# Patient Record
Sex: Female | Born: 1985 | Race: White | Hispanic: Yes | Marital: Married | Smoking: Never smoker
Health system: Southern US, Community
[De-identification: ages and names within clinical notes are randomized; demographics above are authoritative.]

## PROBLEM LIST (undated history)

## (undated) DIAGNOSIS — Z789 Other specified health status: Secondary | ICD-10-CM

---

## 2004-09-10 ENCOUNTER — Inpatient Hospital Stay (HOSPITAL_COMMUNITY): Admission: AD | Admit: 2004-09-10 | Discharge: 2004-09-12 | Payer: Self-pay | Admitting: Family Medicine

## 2011-12-30 ENCOUNTER — Other Ambulatory Visit: Payer: Self-pay

## 2011-12-30 DIAGNOSIS — Z3480 Encounter for supervision of other normal pregnancy, unspecified trimester: Secondary | ICD-10-CM

## 2011-12-30 NOTE — Progress Notes (Signed)
Prenatal labs done today [facility] 

## 2011-12-31 LAB — OBSTETRIC PANEL
Antibody Screen: NEGATIVE
Basophils Relative: 0 % (ref 0–1)
Eosinophils Absolute: 0.1 10*3/uL (ref 0.0–0.7)
HCT: 36.3 % (ref 36.0–46.0)
Hemoglobin: 11.8 g/dL — ABNORMAL LOW (ref 12.0–15.0)
Hepatitis B Surface Ag: NEGATIVE
Lymphs Abs: 1.8 10*3/uL (ref 0.7–4.0)
MCH: 30.5 pg (ref 26.0–34.0)
MCHC: 32.5 g/dL (ref 30.0–36.0)
MCV: 93.8 fL (ref 78.0–100.0)
Monocytes Absolute: 0.7 10*3/uL (ref 0.1–1.0)
Monocytes Relative: 8 % (ref 3–12)
RBC: 3.87 MIL/uL (ref 3.87–5.11)
Rh Type: POSITIVE

## 2011-12-31 LAB — SICKLE CELL SCREEN: Sickle Cell Screen: NEGATIVE

## 2012-01-06 ENCOUNTER — Ambulatory Visit (INDEPENDENT_AMBULATORY_CARE_PROVIDER_SITE_OTHER): Payer: Self-pay | Admitting: Family Medicine

## 2012-01-06 ENCOUNTER — Encounter: Payer: Self-pay | Admitting: Family Medicine

## 2012-01-06 ENCOUNTER — Other Ambulatory Visit (HOSPITAL_COMMUNITY)
Admission: RE | Admit: 2012-01-06 | Discharge: 2012-01-06 | Disposition: A | Discharge status: Home / Self Care | Payer: Self-pay | Source: Ambulatory Visit | Attending: Family Medicine | Admitting: Family Medicine

## 2012-01-06 VITALS — BP 122/75 | Temp 98.7°F | Wt 129.0 lb

## 2012-01-06 DIAGNOSIS — Z113 Encounter for screening for infections with a predominantly sexual mode of transmission: Secondary | ICD-10-CM | POA: Insufficient documentation

## 2012-01-06 DIAGNOSIS — Z01419 Encounter for gynecological examination (general) (routine) without abnormal findings: Secondary | ICD-10-CM | POA: Insufficient documentation

## 2012-01-06 DIAGNOSIS — Z3480 Encounter for supervision of other normal pregnancy, unspecified trimester: Secondary | ICD-10-CM

## 2012-01-06 DIAGNOSIS — N760 Acute vaginitis: Secondary | ICD-10-CM

## 2012-01-06 DIAGNOSIS — Z3400 Encounter for supervision of normal first pregnancy, unspecified trimester: Secondary | ICD-10-CM

## 2012-01-06 DIAGNOSIS — Z3490 Encounter for supervision of normal pregnancy, unspecified, unspecified trimester: Secondary | ICD-10-CM

## 2012-01-06 LAB — POCT WET PREP (WET MOUNT): Clue Cells Wet Prep Whiff POC: NEGATIVE

## 2012-01-06 NOTE — Progress Notes (Signed)
   Subjective:    Tina Pruitt is a G2P1001 Unknown being seen today for her first obstetrical visit.  Her obstetrical history is significant for Late [facility]. Patient does intend to breast feed. Pregnancy history fully reviewed.  No complaints today. Pregnancy has been w/o complications per pt  Patient reports no complaints.  Filed Vitals:   01/06/12 1337  BP: 122/75  Temp: 98.7 F (37.1 C)  Weight: 129 lb (58.514 kg)    HISTORY: OB History    Grav Para Term Preterm Abortions TAB SAB Ect Mult Living   2 1 1  0 0 0 0 0 0 1     # Outc Date GA Lbr Len/2nd Wgt Sex Del Anes PTL Lv   1 TRM 1/06 [redacted]w[redacted]d   F SVD Other No Yes   2 CUR              No past medical history on file. No past surgical history on file. No family history on file.   Exam    Uterus:     Pelvic Exam:    Perineum: No Hemorrhoids, Normal Perineum   Vulva: normal   Vagina:  normal mucosa, normal discharge, wet prep done   pH:    Cervix: Bleeding w/ pap. Closed, thick, long          System:     Skin: normal coloration and turgor, no rashes    Neurologic: oriented, normal mood, grossly non-focal   Extremities: no deformities   HEENT extra ocular movement intact       Neck no masses   Cardiovascular: regular rate and rhythm   Respiratory:  Normal effort   Abdomen: Soft, non-painful to palpation       Fetal hrt tones at 150.     Assessment:    Pregnancy: G2P1001 There is no problem list on file for this patient.       Plan:     Initial labs reviewed Prenatal vitamins. Problem list reviewed and updated. Genetic Screening discussed. Family deciding  Ultrasound discussed; fetal survey: Applying for orange card. Will order after obtaining orange card.  Follow up in 4 weeks. Or sooner if obtain orange card and [place] 50% of 60 min visit spent on counseling and coordination of care.   Family applying for orange card.  Working w/ Marlane Hatcher, Vika Buske, MD 01/06/2012

## 2012-01-06 NOTE — Progress Notes (Signed)
Addended by: Garen Grams F on: 01/06/2012 02:53 PM   Modules accepted: Orders

## 2012-01-06 NOTE — Patient Instructions (Addendum)
Embarazo - Segundo trimestre (Pregnancy - Second Trimester) El segundo trimestre del embarazo (del 3 al 6mes) es un perodo de evolucin rpida para usted y el beb. Hacia el final del sexto mes, el beb mide aproximadamente 23 cm y pesa 680 g. Comenzar a sentir los movimientos del beb entre las 18 y las 20 semanas de embarazo. Podr sentir las pataditas ("quickening en ingls"). Hay un rpido aumento de peso. Puede segregar un lquido claro (calostro) de las mamas. Quizs sienta pequeas contracciones en el vientre (tero) Esto se conoce como falso trabajo de parto o contracciones de Braxton-Hicks. Es como una prctica del trabajo de parto que se produce cuando el beb est listo para salir. Generalmente los problemas de vmitos matinales ya se han superado hacia el final del primer trimestre. Algunas mujeres desarrollan pequeas manchas oscuras (que se denominan cloasma, mscara del embarazo) en la cara que normalmente se van luego del nacimiento del beb. La exposicin al sol empeora las manchas. Puede desarrollarse acn en algunas mujeres embarazadas, y puede desaparecer en aquellas que ya tienen acn. EXAMENES PRENATALES  Durante los exmenes prenatales, deber seguir realizando pruebas de sangre, segn avance el embarazo. Estas pruebas se realizan para controlar su salud y la del beb. Tambin se realizan anlisis de sangre para conocer los niveles de hemoglobina. La anemia (bajo nivel de hemoglobina) es frecuente durante el embarazo. Para prevenirla, se administran hierro y vitaminas. Tambin se le realizarn exmenes para saber si tiene diabetes entre las 24 y las 28 semanas del embarazo. Podrn repetirle algunas de las pruebas que le hicieron previamente.  En cada visita le medirn el tamao del tero. Esto se realiza para asegurarse de que el beb est creciendo correctamente de acuerdo al estado del embarazo.  Tambin en cada visita prenatal controlarn su presin arterial. Esto se realiza  para asegurarse de que no tenga toxemia.  Se controlar su orina para asegurarse de que no tenga infecciones, diabetes o protena en la orina.  Se controlar su peso regularmente para asegurarse que el aumento ocurre al ritmo indicado. Esto se hace para asegurarse que usted y el beb tienen una evolucin normal.  En algunas ocasiones se realiza una prueba de ultrasonido para confirmar el correcto desarrollo y evolucin del beb. Esta prueba se realiza con ondas sonoras inofensivas para el beb, de modo que el profesional pueda calcular ms precisamente la fecha del parto. Algunas veces se realizan pruebas especializadas del lquido amnitico que rodea al beb. Esta prueba se denomina amniocentesis. El lquido amnitico se obtiene introduciendo una aguja en el vientre (abdomen). Se realiza para controlar los cromosomas en aquellos casos en los que existe alguna preocupacin acerca de algn problema gentico que pueda sufrir el beb. En ocasiones se lleva a cabo cerca del final del embarazo, si es necesario inducir al parto. En este caso se realiza para asegurarse que los pulmones del beb estn lo suficientemente maduros como para que pueda vivir fuera del tero. CAMBIOS QUE OCURREN EN EL SEGUNDO TRIMESTRE DEL EMBARAZO Su organismo atravesar numerosos cambios durante el embarazo. Estos pueden variar de una persona a otra. Converse con el profesional que la asiste acerca los cambios que usted note y que la preocupen.  Durante el segundo trimestre probablemente sienta un aumento del apetito. Es normal tener "antojos" de ciertas comidas. Esto vara de una persona a otra y de un embarazo a otro.  El abdomen inferior comenzar a abultarse.  Podr tener la necesidad de orinar con ms frecuencia debido a que   el tero y el beb presionan sobre la vejiga. Tambin es frecuente contraer ms infecciones urinarias durante el embarazo (dolor al orinar). Puede evitarlas bebiendo gran cantidad de lquidos y vaciando  la vejiga antes y despus de mantener relaciones sexuales.  Podrn aparecer las primeras estras en las caderas, abdomen y mamas. Estos son cambios normales del cuerpo durante el embarazo. No existen medicamentos ni ejercicios que puedan prevenir estos cambios.  Es posible que comience a desarrollar venas inflamadas y abultadas (varices) en las piernas. El uso de medias de descanso, elevar sus pies durante 15 minutos, 3 a 4 veces al da y limitar la sal en su dieta ayuda a aliviar el problema.  Podr sentir acidez gstrica a medida que el tero crece y presiona contra el estmago. Puede tomar anticidos, con la autorizacin de su mdico, para aliviar este problema. Tambin es til ingerir pequeas comidas 4 a 5 veces al da.  La constipacin puede tratarse con un laxante o agregando fibra a su dieta. Beber grandes cantidades de lquidos, comer vegetales, frutas y granos integrales es de gran ayuda.  Tambin es beneficioso practicar actividad fsica. Si ha sido una persona activa hasta el embarazo, podr continuar con la mayora de las actividades durante el mismo. Si ha sido menos activa, puede ser beneficioso que comience con un programa de ejercicios, como realizar caminatas.  Puede desarrollar hemorroides (vrices en el recto) hacia el final del segundo trimestre. Tomar baos de asiento tibios y utilizar cremas recomendadas por el profesional que lo asiste sern de ayuda para los problemas de hemorroides.  Tambin podr sentir dolor de espalda durante este momento de su embarazo. Evite levantar objetos pesados, utilice zapatos de taco bajo y mantenga una buena postura para ayudar a reducir los problemas de espalda.  Algunas mujeres embarazadas desarrollan hormigueo y adormecimiento de la mano y los dedos debido a la hinchazn y compresin de los ligamentos de la mueca (sndrome del tnel carpiano). Esto desaparece una vez que el beb nace.  Como sus pechos se agrandan, necesitar un sujetador  ms grande. Use un sostn de soporte, cmodo y de algodn. No utilice un sostn para amamantar hasta el ltimo mes de embarazo si va a amamantar al beb.  Podr observar una lnea oscura desde el ombligo hacia la zona pbica denominada linea nigra.  Podr observar que sus mejillas se ponen coloradas debido al aumento de flujo sanguneo en la cara.  Podr desarrollar "araitas" en la cara, cuello y pecho. Esto desaparece una vez que el beb nace. INSTRUCCIONES PARA EL CUIDADO DOMICILIARIO  Es extremadamente importante que evite el cigarrillo, hierbas medicinales, alcohol y las drogas no prescriptas durante el embarazo. Estas sustancias qumicas afectan la formacin y el desarrollo del beb. Evite estas sustancias durante todo el embarazo para asegurar el nacimiento de un beb sano.  La mayor parte de los cuidados que se aconsejan son los mismos que los indicados para el primer trimestre del embarazo. Cumpla con las citas tal como se le indic. Siga las instrucciones del profesional que lo asiste con respecto al uso de los medicamentos, el ejercicio y la dieta.  Durante el embarazo debe obtener nutrientes para usted y para su beb. Consuma alimentos balanceados a intervalos regulares. Elija alimentos como carne, pescado, leche y otros productos lcteos descremados, vegetales, frutas, panes integrales y cereales. El profesional le informar cul es el aumento de peso ideal.  Las relaciones sexuales fsicas pueden continuarse hasta cerca del fin del embarazo si no existen otros problemas. Estos   problemas pueden ser la prdida temprana (prematura) de lquido amnitico de las membranas, sangrado vaginal, dolor abdominal u otros problemas mdicos o del embarazo.  Realice actividad fsica todos los das, si no tiene restricciones. Consulte con el profesional que la asiste si no sabe con certeza si determinados ejercicios son seguros. El mayor aumento de peso tiene lugar durante los ltimos 2 trimestres del  embarazo. El ejercicio la ayudar a:  Controlar su peso.  Ponerla en forma para el parto.  Ayudarla a perder peso luego de haber dado a luz.  Use un buen sostn o como los que se usan para hacer deportes para aliviar la sensibilidad de las mamas. Tambin puede serle til si lo usa mientras duerme. Si pierde calostro, podr utilizar apsitos en el sostn.  No utilice la baera con agua caliente, baos turcos y saunas durante el embarazo.  Utilice el cinturn de seguridad sin excepcin cuando conduzca. Este la proteger a usted y al beb en caso de accidente.  Evite comer carne cruda, queso crudo, y el contacto con los utensilios y desperdicios de los gatos. Estos elementos contienen grmenes que pueden causar defectos de nacimiento en el beb.  El segundo trimestre es un buen momento para visitar a su dentista y evaluar su salud dental si an no lo ha hecho. Es importante mantener los dientes limpios. Utilice un cepillo de dientes blando. Cepllese ms suavemente durante el embarazo.  Es ms fcil perder algo de orina durante el embarazo. Apretar y fortalecer los msculos de la pelvis la ayudar con este problema. Practique detener la miccin cuando est en el bao. Estos son los mismos msculos que necesita fortalecer. Son tambin los mismos msculos que utiliza cuando trata de evitar los gases. Puede practicar apretando estos msculos 10 veces, y repetir esto 3 veces por da aproximadamente. Una vez que conozca qu msculos debe apretar, no realice estos ejercicios durante la miccin. Puede favorecerle una infeccin si la orina vuelve hacia atrs.  Pida ayuda si tiene necesidades econmicas, de asesoramiento o nutricionales durante el embarazo. El profesional podr ayudarla con respecto a estas necesidades, o derivarla a otros especialistas.  La piel puede ponerse grasa. Si esto sucede, lvese la cara con un jabn suave, utilice un humectante no graso y maquillaje con base de aceite o  crema. CONSUMO DE MEDICAMENTOS Y DROGAS DURANTE EL EMBARAZO  Contine tomando las vitaminas apropiadas para esta etapa tal como se le indic. Las vitaminas deben contener un miligramo de cido flico y deben suplementarse con hierro. Guarde todas las vitaminas fuera del alcance de los nios. La ingestin de slo un par de vitaminas o tabletas que contengan hierro puede ocasionar la muerte en un beb o en un nio pequeo.  Evite el uso de medicamentos, inclusive los de venta libre y hierbas que no hayan sido prescriptos o indicados por el profesional que la asiste. Algunos medicamentos pueden causar problemas fsicos al beb. Utilice los medicamentos de venta libre o de prescripcin para el dolor, el malestar o la fiebre, segn se lo indique el profesional que lo asiste. No utilice aspirina.  El consumo de alcohol est relacionado con ciertos defectos de nacimiento. Esto incluye el sndrome de alcoholismo fetal. Debe evitar el consumo de alcohol en cualquiera de sus formas. El cigarrillo causa nacimientos prematuros y bebs de bajo peso. El uso de drogas recreativas est absolutamente prohibido. Son muy nocivas para el beb. Un beb que nace de una madre adicta, ser adicto al nacer. Ese beb tendr los mismos   causa nacimientos prematuros y bebs de [place]. El uso de drogas recreativas est absolutamente prohibido. Son muy nocivas para el beb. Un beb que nace de [employer], ser adicto al nacer. Ese beb tendr los mismos sntomas de abstinencia que un adulto.   Infrmele al profesional si consume alguna droga.   No consuma drogas ilegales. Pueden causarle mucho dao al beb.  SOLICITE ATENCIN MDICA SI: Tiene preguntas o preocupaciones durante su embarazo. Es mejor que llame para Science writer las dudas que esperar hasta su prxima visita prenatal. Thressa Sheller forma se sentir ms tranquila.  SOLICITE ATENCIN MDICA DE INMEDIATO SI:  La temperatura oral se eleva sin motivo por encima de 102 F (38.9 C) o segn le indique el profesional que lo asiste.   Tiene una prdida de lquido por la vagina (canal de parto). Si sospecha una ruptura de las [place], tmese la  temperatura y llame al profesional para informarlo sobre esto.   Observa unas pequeas manchas, una hemorragia vaginal o elimina cogulos. Notifique al profesional acerca de la cantidad y de cuntos apsitos est utilizando. Unas pequeas manchas de sangre son algo comn durante el Psychiatrist, especialmente despus de Sales promotion account executive.   Presenta un olor desagradable en la secrecin vaginal y observa un cambio en el color, de transparente a blanco.   Contina con las nuseas y no obtiene alivio de los remedios indicados. Vomita sangre o algo similar a la borra del caf.   [place] o sube ms de 900 g. en una semana, o segn lo indicado por el profesional que la asiste.   Observa que se le [employer], las manos, los pies o las piernas.   Ha estado expuesta a la rubola y no ha sufrido la enfermedad.   Ha estado expuesta a la quinta enfermedad o a la varicela.   Presenta dolor abdominal. Las molestias en el ligamento redondo son Neomia Dear causa no cancerosa (benigna) frecuente de dolor abdominal durante el embarazo. El profesional que la asiste deber evaluarla.   Presenta dolor de cabeza intenso que no se [place].   Presenta fiebre, diarrea, dolor al orinar o le falta la respiracin.   Presenta dificultad para ver, visin borrosa, o visin doble.   Sufre una cada, un accidente de trnsito o cualquier tipo de trauma.   Vive en un hogar en el que existe violencia fsica o mental.  Document Released: 06/03/2005 Document Revised: 08/13/2011 [facility] Patient Information 2012 [place], [place]. Embarazo - Primer trimestre (Pregnancy - First Trimester) Durante el acto sexual, millones de espermatozoides ingresan a la vagina. Slo Education administrator y Economist en vulo femenino mientras se encuentre en las [place] de [place]. Una semana despus, el huevo fertilizado se implanta en la pared del tero. Un embrin comienza a desarrollarse y se convierte en un beb. Entre la 6 y la 8 semanas se  forman los ojos y el rostro y el latido cardaco se detecta con Nurse, adult. Hacia el final de la 12 semana (primer trimestre) se han formado todos los rganos del beb. Ahora que est embarazada, desear hacer todo lo posible para tener un beb sano. Dos de las cosas ms importantes son: Winferd Humphrey buena atencin prenatal y seguir las indicaciones del profesional que la asiste. La atencin prenatal incluye toda la asistencia mdica que usted recibe antes del nacimiento del beb. Se lleva a cabo para prevenir problemas durante el embarazo y [place]. EXAMENES PRENATALES  Durante los controles prenatales, le tomarn la presin arterial, verificarn su  peso y [place] harn [place] de [place]. Todo esto se realiza para asegurar que usted progresa de [place] normal y Office manager.   Una mujer embarazada ganar de 25 a 35 libras (8 a 12 kilos) Academic librarian. Sin embargo, si tiene sobrepeso o bajo peso el mdico le aconsejar acerca de [place].   Entre los [employer] solicitarn anlisis de [place], cultivos de cuello de [place], [place] y [place]. Estas pruebas se realizan para controlar su salud y la del beb. Se recomienda realizar todos los [employer] y tambin pruebas para la deteccin del VIH, si usted lo autoriza. Este es el virus que causa el [place]. Estas pruebas se realizan porque existen medicamentos que podrn administrarle para prevenir que el beb nazca con esta infeccin, si usted estuviera infectada y no lo supiera. Tambin se solicitan anlisis de sangre para conocer su grupo sanguneo, infecciones previas y para Chief Operating Officer los glbulos rojos (hemoglobina).   Un bajo nivel de hemoglobina (anemia) es frecuente durante el embarazo. Para prevenirla, se administran hierro y vitaminas. En etapas posteriores del [employer] solicitarn anlisis de sangre para descartar diabetes y otros anlisis para Sales promotion account executive otros problemas. Los anlisis son necesarios para verificar  que usted y el beb estn bien.   Necesitar otras pruebas que se realizan para asegurarse de que el beb est saludable.  CAMBIOS DURANTE EL PRIMER TRIMESTRE (LOS TRES PRIMEROS MESES DEL EMBARAZO). Su organismo atravesar numerosos cambios Academic librarian. Estos pueden variar de Neomia Dear persona a otra. [place]verse con el profesional que la asiste acerca los cambios que usted note y que la preocupen.  El perodo menstrual se detiene.   El vulo y los espermatozoides llevan los genes que determinan cmo seremos. Sus genes y los de su pareja forman un beb. Los genes del varn determinan si ser un nio o una nia.   No tome ningn medicamento a menos que se lo haya indicado el profesional que la asiste.   Aumentar la circunferencia de su cuerpo y se sentir hinchada.   Podr sentir nuseas o vmitos. Si los vmitos son incontrolables, comunquese con el profesional que la asiste.   Sus mamas comenzarn a agrandarse y estarn ms sensibles.   Los pezones salen hacia afuera y se oscurecen.   Mayor necesidad de Geographical information systems officer. Si siente dolor al orinar podra tener una infeccin urinaria.   Cansarse fcilmente.   Prdida del apetito.   Retorcijones por ciertos tipos de alimentos.   Al principio, podr ganar o perder algo de peso.   Podr sentir Warden/ranger (alegra por Firefighter o preocupacin acerca de que haya algo mal en el embarazo o con el beb).   Podr tener sueos ms vvidos y fuertes.  INSTRUCCIONES PARA EL CUIDADO DOMICILIARIO  Es extremadamente importante que evite el cigarrillo, el alcohol y las drogas no prescriptas durante el Psychiatrist. Estas sustancias qumicas afectan la formacin y el desarrollo del beb. Evite estas sustancias qumicas durante todo el embarazo para asegurar el nacimiento de un beb sano.   Comience las consultas prenatales alrededor de la 12 semana de [place]. Generalmente se programan cada mes al principio y se hacen ms frecuentes en  los 2 ltimos meses antes del parto. Cumpla con las citas tal como se le indic. Siga las indicaciones del profesional con respecto a los medicamentos que toma, los anlisis de laboratorio, la actividad fsica y Psychologist, forensic.   Durante el embarazo debe obtener nutrientes para usted y para su beb.  Consuma alimentos balanceados. Elija alimentos como carne, pescado, [place] y otros productos lcteos descremados, vegetales, frutas, panes integrales y cereales. El Equities trader cul es el aumento de peso ideal.   Las nuseas matinales pueden aliviarse si come algunas galletitas saladas en la cama. Coma dos galletitas antes de levantarse por la maana. Tambin puede comer galletitas sin sal.   Tome 4  5 comidas pequeas por Geophysical data processor de 3 comidas abundantes para evitar las nuseas y los vmitos.   Tambin puede prevenir las nuseas bebiendo lquidos entre comidas en lugar de [place] come.   Si no tiene problemas, puede continuar [employer] a lo largo de todo Firefighter. Algunos problemas pueden ser la prdida prematura de lquido amnitico de las [place], hemorragias vaginales o dolor en el abdomen.   Realice [employer], si no tiene restricciones. Consulte con el profesional que la asiste o con un fisioterapeuta si no sabe con certeza si determinados ejercicios son seguros. En los dos ltimos trimestres del embarazo puede ocurrir un aumento de [place]. La actividad fsica ayudar a:   Engineering geologist.   Mantenerse en forma.   Prepararse para el parto y [place] de [place].   Ayuda a perder el peso ganado en el embarazo luego del [place].   Use un buen sostn o como los que se usan para hacer deportes para Paramedic la sensibilidad de las [place]. Tambin puede serle til si lo [place] mientras duerme.   Consulte cuando tendr el curso preparatorio para Engineer, manufacturing systems. Comience las clases cuando se las ofrezcan.   No utilice la baera con agua  caliente, baos turcos y saunas.   Colquese el cinturn de seguridad cuando conduzca. Este la proteger a usted y al beb en caso de accidente.   Evite comer carne cruda y el contacto con los utensilios y desperdicios de los gatos. Estos elementos contienen grmenes que pueden causar defectos de nacimiento en el beb.   El primer trimestre es un buen momento para visitar a su dentista y Software engineer. Es importante mantener los dientes limpios. Use un cepillo de dientes suave y cepllese con ms suavidad durante el [employer].   Pida ayuda si tiene necesidades econmicas, de asesoramiento o nutricionales durante el [place]. El profesional podr ayudarla con respecto a estas necesidades, o derivarla a otros especialistas.   No tome ningn medicamento a menos que se lo haya indicado el profesional que la asiste.   Informe al profesional que lo asiste si es vctima de algn tipo de Advertising copywriter, ya sea mental o fsica.   Haga una lista de nmeros de telfono de Associate Professor de familiares, amigos, hospital, polica y bomberos.   Anote sus preguntas. Llvelas con usted en su prxima visita de control.   No utilice duchas vaginales.   No cruce las piernas.   Si ha estado parada durante largos perodos de [place], mueva los pies o realice pequeos pasos en crculo.   Podr tener secreciones vaginales que pueden requerir una compresa o [place]. No utilice tampones ni compresas con perfume.  EL CONSUMO DE MEDICAMENTOS Y DROGAS DURANTE EL EMBARAZO  Tome las vitaminas apropiadas para esta etapa tal como se le indic. Las vitaminas deben contener 1 miligramo de cido flico. Guarde todas las vitaminas fuera del alcance de los nios. La ingestin de slo un par de vitaminas o tabletas que contengan hierro pueden ocasionar la [employer] en un beb o en un nio pequeo.  Evite el uso de medicamentos, inclusive hierbas, que no hayan sido prescriptos o indicados por el profesional que la  asiste. Utilice los medicamentos de venta libre o de prescripcin para Chief Technology Officer, Environmental health practitioner o la [place], segn se lo indique el profesional que lo asiste. No utilice aspirina, ibuprofeno o naproxeno a menos que el profesional la autorice.   El consumo de alcohol se relaciona con ciertos defectos de nacimiento. Entre ellos el sndrome de alcoholismo fetal. Debe evitar el consumo de alcohol en cualquiera de sus formas. El cigarrillo causa nacimientos prematuros y bebs de [place].   Las drogas ocasionan terribles daos al beb. Estn absolutamente prohibidas. Un beb que nace de [employer], ser adicto al nacer. Ese beb tendr los mismos sntomas de abstinencia que un adulto.   No consuma drogas. Pueden daar gravemente al beb.   Converse con el mdico acerca de cualquier medicamento que est tomando y la razn por la cual los toma.  EL ABORTO ESPONTNEO ES UNA SITUACIN FRECUENTE Esto no significa que ha hecho las cosas mal. No es un motivo para preocuparse en caso de un nuevo embarazo. El profesional que la asiste la ayudar si tiene preguntas para formular. Si ha sufrido un aborto espontneo, Pension scheme manager someterse a Futures trader de curetaje, SOLICITE ATENCIN MDICA SI: Tiene alguna preocupacin durante el embarazo. Es mejor que llame para formular las preguntas si no puede esperar hasta la prxima visita, que sentirse preocupada por ellas. SOLICITE ATENCIN MDICA DE INMEDIATO SI:  La temperatura oral se eleva sin motivo por encima de 102 F (38.9 C) o segn le indique el profesional que lo asiste.   Tiene una prdida de lquido por la vagina (canal de parto). Si sospecha una ruptura de las [place], tmese la temperatura y llame al profesional para informarlo sobre esto.   Observa unas pequeas manchas o una hemorragia vaginal. Notifique al profesional acerca de la cantidad y de cuntos apsitos est utilizando.   Presenta un olor desagradable en la secrecin vaginal y  observa un cambio en el color.   Contina con las nuseas y no obtiene alivio de los remedios indicados. Vomita sangre o algo similar a la borra del caf.   Pierde ms de 1 Kg de peso en [employer].   Aumenta ms de 1 Kg en una semana y [address], las manos, los pies o las piernas hinchados.   Aumenta ms de 2,5 Kg en una semana (aunque no tenga las manos, pies, piernas o el rostro hinchados).   Ha estado expuesta a la rubola y no ha sufrido la enfermedad. Ha estado expuesta a la quinta enfermedad o a la varicela.   Ha estado expuesta a la quinta enfermedad o a la varicela.   Presenta dolor abdominal. Las molestias en el ligamento redondo son Neomia Dear causa benigna (no cancerosa) frecuente de dolor abdominal durante el embarazo. El profesional que la asiste deber evaluarla.   Presenta dolor de Renne Musca, diarrea, dolor al orinar o le falta la respiracin.   Sufre una cada, un accidente automovilstico o algn traumatismo.   Sufre violencia fsica o mental en su hogar.  Document Released: 06/03/2005 Document Revised: 08/13/2011 [facility] Patient Information 2012 [place], [place].

## 2012-01-06 NOTE — Progress Notes (Signed)
Addended by: Garen Grams F on: 01/06/2012 02:51 PM   Modules accepted: Orders

## 2012-01-11 ENCOUNTER — Encounter: Payer: Self-pay | Admitting: Family Medicine

## 2012-01-11 NOTE — Progress Notes (Signed)
Note reviewed.  Will need to obtain some more history to determine if pt needs an early glucola. If unsure LMP, will need dating sono as soon as possible.

## 2012-02-02 ENCOUNTER — Telehealth: Payer: Self-pay | Admitting: Family Medicine

## 2012-02-02 NOTE — Telephone Encounter (Signed)
Husband called to let us know pt has a OC and fmc can  sch [place]. [employer]

## 2012-02-05 ENCOUNTER — Ambulatory Visit (INDEPENDENT_AMBULATORY_CARE_PROVIDER_SITE_OTHER): Payer: Self-pay | Admitting: Family Medicine

## 2012-02-05 VITALS — BP 105/68 | Temp 98.5°F | Wt 134.4 lb

## 2012-02-05 DIAGNOSIS — Z3480 Encounter for supervision of other normal pregnancy, unspecified trimester: Secondary | ICD-10-CM

## 2012-02-05 DIAGNOSIS — Z3490 Encounter for supervision of normal pregnancy, unspecified, unspecified trimester: Secondary | ICD-10-CM

## 2012-02-05 NOTE — Patient Instructions (Addendum)
Please return in 1 month.  Hemorragia vaginal durante el embarazo, segundo trimestre (Vaginal Bleeding During Pregnancy, Second Trimester)   urante el embarazo es relativamente frecuente que se presente una pequea hemorragia (manchas). Esta situacin generalmente mejora por s misma. Existen muchas causas para la hemorragia o prdidas durante el embarazo. Algunas hemorragias pueden estar relacionadas al [employer] Lots y otras no. Si aparecen retortijones con la hemorragia es ms serio y preocupante. Informe al mdico si tiene cualquier tipo de hemorragia vaginal.  CAUSAS  Infeccin, inflamacin o tumores en el tero.   La placenta puede estar cubriendo la apertura del tero de [place] total o parcial.   La placenta puede haberse separado del tero.   Usted puede estar sufriendo un trabajo de parto prematuro.   El tero no est lo suficientemente fuerte para Pharmacologist al beb dentro del tero (insuficiencia cervical).   Muchos quistes pequeos en el tero en lugar del tejido del embarazo (embarazo molar).  SNTOMAS  Hemorragia o goteo vaginal con o sin retortijones.   Contracciones uterinas.   Flujo vaginal anormal.   Podra tener goteo despus de Management consultant.  DIAGNSTICO Para evaluar el embarazo, el mdico podr:  Education officer, environmental un examen plvico.   Tomar anlisis de [place].   Realizar una prueba de [place].  Es importante seguir las indicaciones del profesional que la asiste.  TRATAMIENTO  Evaluacin del embarazo con anlisis de [place] y [place].   Reposo en cama (slo levantarse para ir al bao).   Inmunizacin con Rho-gam si la madre es Rh negativo y el padre es Rh positivo.   Si tiene contracciones uterinas, se le podrn administrar medicamentos para detenerlas.   Si tiene insuficiencia cervical, se le podr realizar una sutura en el tero para cerrarlo.  INSTRUCCIONES PARA EL CUIDADO DOMICILIARIO  Si el mdico le indica reposo en cama, usted  necesitar hacer algunos arreglos para que otra persona se ocupe del cuidado de los nios y de otras responsabilidades adicionales. Sin embargo, podr autorizarla a Building surveyor.   Lleve un registro de la cantidad y la saturacin de las toallas higinicas que Landscape architect. Escriba esta informacin:   No use tampones. No utilice duchas vaginales.   No tenga relaciones sexuales u orgasmos hasta que el mdico la autorice.   Guarde una muestra de los tejidos para que el profesional lo inspeccione.   Tome medicamentos para el dolor slo con el permiso del mdico.   No tome aspirina, ya que puede causar hemorragias.   No realice ejercicio, actividades estresantes ni levante peso sin el permiso del mdico.  SOLICITE ATENCIN MDICA INMEDIATAMENTE SI:  Siente calambres intensos en el estmago, en la espalda o en el vientre (abdomen).   Tiene contracciones uterinas.   Usted tiene una temperatura oral de ms de 102 F (38.9 C) y no puede controlarla con medicamentos.   Comienza a sentir escalofros.   Elimina cogulos o tejidos grandes.   La hemorragia aumenta o se siente mareada dbil o tiene episodios de desmayos.   Tiene una prdida de lquido por la vagina.  Document Released: 06/03/2005 Document Revised: 08/13/2011 [facility] Patient Information 2012 [place], [place].

## 2012-02-05 NOTE — Progress Notes (Signed)
[place] pt. Doing well. No complaints today.  Early glucola not indicated due to negative fmhx and previous pregnancy w/o complication w/ 6lb baby, and BMI <29.   Labor precautions reviewed  Reasons for MAU visit reviewed.   Will send for [place] complete at womens.  VS reviewed See pt back in 1 month

## 2012-02-17 ENCOUNTER — Ambulatory Visit (HOSPITAL_COMMUNITY)
Admission: RE | Admit: 2012-02-17 | Discharge: 2012-02-17 | Disposition: A | Discharge status: Home / Self Care | Payer: Self-pay | Source: Ambulatory Visit | Attending: Family Medicine | Admitting: Family Medicine

## 2012-02-17 DIAGNOSIS — O0930 Supervision of pregnancy with insufficient antenatal care, unspecified trimester: Secondary | ICD-10-CM | POA: Insufficient documentation

## 2012-02-17 DIAGNOSIS — Z3689 Encounter for other specified antenatal screening: Secondary | ICD-10-CM | POA: Insufficient documentation

## 2012-02-17 DIAGNOSIS — Z3490 Encounter for supervision of normal pregnancy, unspecified, unspecified trimester: Secondary | ICD-10-CM

## 2012-03-04 ENCOUNTER — Ambulatory Visit (INDEPENDENT_AMBULATORY_CARE_PROVIDER_SITE_OTHER): Payer: Self-pay | Admitting: Family Medicine

## 2012-03-04 DIAGNOSIS — Z331 Pregnant state, incidental: Secondary | ICD-10-CM

## 2012-03-04 NOTE — Progress Notes (Signed)
[place] pt doing very well.  Occasional LE edema by the end of the day after ambulation that improves w/ rest and elevation. This was present w/ other pregnancies. Denies any dysuria, or frothy urine  [place] reviewed and normal Reviewed labor precautions  Reasones for MAU visit reviewed  See pt back in 1 month

## 2012-03-04 NOTE — Patient Instructions (Addendum)
Come back in 1 month  Hemorragia vaginal durante el embarazo, segundo trimestre (Vaginal Bleeding During Pregnancy, Second Trimester) Durante el embarazo es relativamente frecuente que se presente una pequea hemorragia (manchas). Esta situacin generalmente mejora por s misma. Existen muchas causas para la hemorragia o prdidas durante el embarazo. Algunas hemorragias pueden estar relacionadas al [employer] y otras no. Si aparecen retortijones con la hemorragia es ms serio y preocupante. Informe al mdico si tiene cualquier tipo de hemorragia vaginal.  CAUSAS  Infeccin, inflamacin o tumores en el tero.   La placenta puede estar cubriendo la apertura del tero de [place] total o parcial.   La placenta puede haberse separado del tero.   Usted puede estar sufriendo un trabajo de parto prematuro.   El tero no est lo suficientemente fuerte para Pharmacologist al beb dentro del tero (insuficiencia cervical).   Muchos quistes pequeos en el tero en lugar del tejido del embarazo (embarazo molar).  SNTOMAS  Hemorragia o goteo vaginal con o sin retortijones.   Contracciones uterinas.   Flujo vaginal anormal.   Podra tener goteo despus de Management consultant.  DIAGNSTICO Para evaluar el embarazo, el mdico podr:  Education officer, environmental un examen plvico.   Tomar anlisis de [place].   Realizar una prueba de [place].  Es importante seguir las indicaciones del profesional que la asiste.  TRATAMIENTO  Evaluacin del embarazo con anlisis de [place].   Reposo en cama (slo levantarse para ir al bao).   Inmunizacin con Rho-gam si la madre es Rh negativo y el padre es Rh positivo.   Si tiene contracciones uterinas, se le podrn administrar medicamentos para detenerlas.   Si tiene insuficiencia cervical, se le podr realizar una sutura en el tero para cerrarlo.  INSTRUCCIONES PARA EL CUIDADO DOMICILIARIO  Si el mdico le indica reposo en cama, usted necesitar hacer  algunos arreglos para que otra persona se ocupe del cuidado de los nios y de otras responsabilidades adicionales. Sin embargo, podr autorizarla a Building surveyor.   Lleve un registro de la cantidad y la saturacin de las toallas higinicas que Landscape architect. Escriba esta informacin:   No use tampones. No utilice duchas vaginales.   No tenga relaciones sexuales u orgasmos hasta que el mdico la autorice.   Guarde una muestra de los tejidos para que el profesional lo inspeccione.   Tome medicamentos para el dolor slo con el permiso del mdico.   No tome aspirina, ya que puede causar hemorragias.   No realice ejercicio, actividades estresantes ni levante peso sin el permiso del mdico.  SOLICITE ATENCIN MDICA INMEDIATAMENTE SI:  Siente calambres intensos en el estmago, en la espalda o en el vientre (abdomen).   Tiene contracciones uterinas.   Usted tiene una temperatura oral de ms de 102 F (38.9 C) y no puede controlarla con medicamentos.   Comienza a sentir escalofros.   Elimina cogulos o tejidos grandes.   La hemorragia aumenta o se siente mareada dbil o tiene episodios de desmayos.   Tiene una prdida de lquido por la vagina.  Document Released: 06/03/2005 Document Revised: 08/13/2011 [facility] Patient Information 2012 [place], [place].   Embarazo - Segundo trimestre (Pregnancy - Second Trimester) El segundo trimestre del Psychiatrist (del 3 al ) es un perodo de evolucin rpida para usted y el beb. Hacia el final del sexto mes, el beb mide aproximadamente 23 cm y pesa 680 g. Comenzar a Pharmacologist del beb entre las 18 y las 20 [address] de  embarazo. Podr sentir las pataditas ("quickening en ingls"). Hay un rpido [employer]. Puede segregar un lquido claro Charity fundraiser) de las [place]. Quizs sienta pequeas contracciones en el vientre (tero) Esto se conoce como falso trabajo de parto o contracciones de [place]. Es como una  prctica del trabajo de parto que se produce cuando el beb est listo para salir. Generalmente los problemas de vmitos matinales ya se han superado hacia el final del Medical sales representative. Algunas mujeres desarrollan pequeas manchas oscuras (que se denominan cloasma, mscara del embarazo) en la cara que normalmente se van luego del nacimiento del beb. La exposicin al sol empeora las manchas. Puede desarrollarse acn en algunas mujeres embarazadas, y puede desaparecer en aquellas que ya tienen acn. EXAMENES PRENATALES  Durante los [employer], deber seguir realizando pruebas de [place], segn avance el [place]. Estas pruebas se realizan para controlar su salud y la del beb. Tambin se realizan anlisis de sangre para [employer] niveles de [place]. La anemia (bajo nivel de hemoglobina) es frecuente durante el embarazo. Para prevenirla, se administran hierro y vitaminas. Tambin se le realizarn exmenes para saber si tiene diabetes entre las 24 y las 28 semanas del [place]. Podrn repetirle algunas de las [employer]ov[employer] Enterprises hicieron previamente.   En cada visita le medirn el tamao del tero. Esto se realiza para asegurarse de que el beb est creciendo correctamente de acuerdo al estado del [place].   Tambin en cada visita prenatal controlarn su presin arterial. Esto se realiza para asegurarse de que no tenga toxemia.   Se controlar su orina para asegurarse de que no tenga infecciones, diabetes o protena en la orina.   Se controlar su peso regularmente para asegurarse que el aumento ocurre al ritmo indicado. Esto se hace para asegurarse que usted y el beb tienen una evolucin normal.   En algunas ocasiones se realiza una prueba de ultrasonido para confirmar el correcto desarrollo y evolucin del beb. Esta prueba se realiza con ondas sonoras inofensivas para el beb, de modo que el profesional pueda calcular ms precisamente la fecha del [place].  Algunas veces se realizan pruebas  especializadas del lquido amnitico que rodea al beb. Esta prueba se denomina amniocentesis. El lquido amnitico se obtiene introduciendo una aguja en el vientre (abdomen). Se realiza para Conservator, museum/gallery en los que existe alguna preocupacin acerca de algn problema gentico que pueda sufrir el beb. En ocasiones se lleva a cabo cerca del final del embarazo, si es necesario inducir al Apple Computer. En este caso se realiza para asegurarse que los pulmones del beb estn lo suficientemente maduros como para que pueda vivir fuera del tero. CAMBIOS QUE OCURREN EN EL SEGUNDO TRIMESTRE DEL EMBARAZO Su organismo atravesar numerosos cambios durante el [employer] Lots. Estos pueden variar de Neomia Dear persona a otra. [place] con el profesional que la asiste acerca los cambios que usted note y que la preocupen.  Durante el segundo trimestre probablemente sienta un aumento del apetito. Es normal tener "antojos" de Development worker, community. Esto vara de Neomia Dear persona a otra y de un embarazo a Therapist, art.   El abdomen inferior comenzar a abultarse.   Podr tener la necesidad de Geographical information systems officer con ms frecuencia debido a que el tero y el beb presionan sobre la vejiga. Tambin es frecuente contraer ms infecciones urinarias durante el embarazo (dolor al ConocoPhillips). Puede evitarlas bebiendo gran cantidad de lquidos y vaciando la vejiga antes y despus de Sales promotion account executive.   Podrn aparecer las primeras Albertson's caderas,  abdomen y mamas. Estos son cambios normales del cuerpo durante el [place]. No existen medicamentos ni ejercicios que puedan prevenir CarMax.   Es posible que comience a desarrollar venas inflamadas y abultadas (varices) en las piernas. El uso de medias de descanso, Optometrist sus pies durante 15 minutos, 3 a 4 veces al da y Film/video editor la sal en su dieta ayuda a Journalist, newspaper.   Podr sentir Engineering geologist gstrica a medida que el tero crece y Doctor, general practice. Puede tomar  anticidos, con la autorizacin de su mdico, para Financial planner. Tambin es til ingerir pequeas comidas 4 a 5 veces al Futures trader.   La constipacin puede tratarse con un laxante o agregando fibra a su dieta. Beber grandes cantidades de lquidos, comer vegetales, frutas y granos integrales es de [place].   Tambin es beneficioso practicar actividad fsica. Si ha sido una persona Engineer, mining, podr continuar con la Harley[employer]Davidson de las actividades durante el mismo. Si ha sido [employer], puede ser beneficioso que comience con un programa de ejercicios, Museum/gallery exhibitions officer.   Puede desarrollar hemorroides (vrices en el recto) hacia el final del segundo trimestre. Tomar baos de asiento tibios y Chemical engineer cremas recomendadas por el profesional que lo asiste sern de ayuda para los problemas de hemorroides.   Tambin podr Financial risk analyst de espalda durante este momento de su embarazo. Evite levantar objetos pesados, utilice zapatos de taco bajo y [place] buena postura para ayudar a reducir los problemas de [place].   Algunas mujeres embarazadas desarrollan hormigueo y adormecimiento de la mano y los dedos debido a la hinchazn y compresin de los ligamentos de la mueca (sndrome del tnel carpiano). Esto desaparece una vez que el beb nace.   Como sus pechos se agrandan, Pension scheme manager un sujetador ms grande. Use un sostn de soporte, cmodo y de algodn. No utilice un sostn para amamantar hasta el ltimo mes de embarazo si va a amamantar al beb.   Podr observar una lnea oscura desde el ombligo hacia la zona pbica denominada linea nigra.   Podr observar que sus mejillas se ponen coloradas debido al aumento de flujo sanguneo en la cara.   Podr desarrollar "araitas" en la cara, cuello y pecho. Esto desaparece una vez que el beb nace.  INSTRUCCIONES PARA EL CUIDADO DOMICILIARIO  Es extremadamente importante que evite el cigarrillo, hierbas medicinales, alcohol y las  drogas no prescriptas durante el Psychiatrist. Estas sustancias qumicas afectan la formacin y el desarrollo del beb. Evite estas sustancias durante todo el embarazo para asegurar el nacimiento de un beb sano.   La mayor parte de los cuidados que se aconsejan son los mismos que los indicados para Financial risk analyst trimestre del Psychiatrist. Cumpla con las citas tal como se le indic. Siga las instrucciones del profesional que lo asiste con respecto al uso de los medicamentos, el ejercicio y Psychologist, forensic.   Durante el embarazo debe obtener nutrientes para usted y para su beb. Consuma alimentos balanceados a intervalos regulares. Elija alimentos como carne, pescado, [place] y otros productos lcteos descremados, vegetales, frutas, panes integrales y cereales. El Equities trader cul es el aumento de peso ideal.   Las relaciones sexuales fsicas pueden continuarse hasta cerca del fin del embarazo si no existen otros problemas. Estos problemas pueden ser la prdida temprana (prematura) de lquido amnitico de las [place], sangrado vaginal, dolor abdominal u otros problemas mdicos o del Psychiatrist.   Realice Tesoro Corporation, si no  tiene restricciones. Consulte con el profesional que la asiste si no sabe con certeza si determinados ejercicios son seguros. El mayor aumento de peso tiene Environmental consultant durante los ltimos 2 trimestres del Psychiatrist. El ejercicio la ayudar a:   Engineering geologist.   Ponerla en forma para el parto.   Ayudarla a perder peso luego de haber dado a luz.   Use un buen sostn o como los que se usan para hacer deportes para Paramedic la sensibilidad de las [place]. Tambin puede serle til si lo [place] mientras duerme. Si pierde Product manager, podr Parker Hann[employer]in.   No utilice la baera con agua caliente, baos turcos y saunas durante el [address]   Utilice el cinturn de seguridad sin excepcin cuando conduzca. Este la proteger a usted y al beb en caso de accidente.    Evite comer carne cruda, queso crudo, y el contacto con los utensilios y desperdicios de los gatos. Estos elementos contienen grmenes que pueden causar defectos de nacimiento en el beb.   El segundo trimestre es un buen momento para visitar a su dentista y Software engineer si an no lo ha hecho. Es Primary school teacher los dientes limpios. Utilice un cepillo de dientes blando. Cepllese ms suavemente durante el embarazo.   Es ms fcil perder algo de orina durante el [place]. Apretar y Chief Operating Officer los msculos de la pelvis la ayudar con este problema. Practique detener la miccin cuando est en el bao. Estos son los mismos msculos que Development worker, international aid. Son [employer] mismos msculos que utiliza cuando trata de Ryder System gases. Puede practicar apretando estos msculos 10 veces, y repetir esto 3 veces por da aproximadamente. Una vez que conozca qu msculos debe apretar, no realice estos ejercicios durante la miccin. Puede favorecerle una infeccin si la orina vuelve hacia atrs.   Pida ayuda si tiene necesidades econmicas, de asesoramiento o nutricionales durante el [place]. El profesional podr ayudarla con respecto a estas necesidades, o derivarla a otros especialistas.   La piel puede ponerse grasa. Si esto sucede, lvese la cara con un jabn [place], utilice un humectante no graso y [place] con base de aceite o crema.  CONSUMO DE MEDICAMENTOS Y DROGAS DURANTE EL EMBARAZO  Contine tomando las vitaminas apropiadas para esta etapa tal como se le indic. Las vitaminas deben contener un miligramo de cido flico y deben suplementarse con hierro. Guarde todas las vitaminas fuera del alcance de los nios. La ingestin de slo un par de vitaminas o tabletas que contengan hierro puede ocasionar la [employer]mont Mining en un beb o en un nio pequeo.   Evite el uso de [place], inclusive los de venta libre y hierbas que no hayan sido prescriptos o indicados por el profesional que la asiste.  Algunos medicamentos pueden causar problemas fsicos al beb. Utilice los medicamentos de venta libre o de prescripcin para Chief Technology Officer, Environmental health practitioner o la [place], segn se lo indique el profesional que lo asiste. No utilice aspirina.   El consumo de alcohol est relacionado con ciertos defectos de nacimiento. Esto incluye el sndrome de alcoholismo fetal. Debe evitar el consumo de alcohol en cualquiera de sus formas. El cigarrillo causa nacimientos prematuros y bebs de [place]. El uso de drogas recreativas est absolutamente prohibido. Son muy nocivas para el beb. Un beb que nace de American Express, ser adicto al nacer. Ese beb tendr los mismos sntomas de abstinencia que un adulto.   Infrmele al profesional si consume alguna droga.   No consuma  drogas ilegales. Pueden causarle mucho dao al beb.  SOLICITE ATENCIN MDICA SI: Tiene preguntas o preocupaciones durante su embarazo. Es mejor que llame para Science writer las dudas que esperar hasta su prxima visita prenatal. Thressa Sheller forma se sentir ms tranquila.  SOLICITE ATENCIN MDICA DE INMEDIATO SI:  La temperatura oral se eleva sin motivo por encima de 102 F (38.9 C) o segn le indique el profesional que lo asiste.   Tiene una prdida de lquido por la vagina (canal de parto). Si sospecha una ruptura de las [place], tmese la temperatura y llame al profesional para informarlo sobre esto.   Observa unas pequeas manchas, una hemorragia vaginal o elimina cogulos. Notifique al profesional acerca de la cantidad y de cuntos apsitos est utilizando. Unas pequeas manchas de sangre son algo comn durante el Psychiatrist, especialmente despus de Sales promotion account executive.   Presenta un olor desagradable en la secrecin vaginal y observa un cambio en el color, de transparente a blanco.   Contina con las nuseas y no obtiene alivio de los remedios indicados. Vomita sangre o algo similar a la borra del caf.   [place] o sube ms de 900 g. en  una semana, o segn lo indicado por el profesional que la asiste.   Observa que se le Southwest Airlines, las manos, los pies o las piernas.   Ha estado expuesta a la rubola y no ha sufrido la enfermedad.   Ha estado expuesta a la quinta enfermedad o a la varicela.   Presenta dolor abdominal. Las molestias en el ligamento redondo son Neomia Dear causa no cancerosa (benigna) frecuente de dolor abdominal durante el embarazo. El profesional que la asiste deber evaluarla.   Presenta dolor de cabeza intenso que no se [place].   Presenta fiebre, diarrea, dolor al orinar o le falta la respiracin.   Presenta dificultad para ver, visin borrosa, o visin doble.   Sufre una cada, un accidente de trnsito o cualquier tipo de trauma.   Vive en un hogar en el que existe violencia fsica o mental.  Document Released: 06/03/2005 Document Revised: 08/13/2011 [facility] Patient Information 2012 [place], [place].

## 2012-04-05 ENCOUNTER — Ambulatory Visit (INDEPENDENT_AMBULATORY_CARE_PROVIDER_SITE_OTHER): Payer: Self-pay | Admitting: Family Medicine

## 2012-04-05 VITALS — BP 98/62 | Ht 60.2 in | Wt 145.7 lb

## 2012-04-05 DIAGNOSIS — Z331 Pregnant state, incidental: Secondary | ICD-10-CM

## 2012-04-05 DIAGNOSIS — Z3480 Encounter for supervision of other normal pregnancy, unspecified trimester: Secondary | ICD-10-CM

## 2012-04-05 LAB — GLUCOSE, CAPILLARY: Glucose-Capillary: 149 mg/dL — ABNORMAL HIGH (ref 70–99)

## 2012-04-05 NOTE — Patient Instructions (Addendum)
Thank you for coming into clinic today.  You are doing great Please continue taking your prenatal vitamin Please come back to see me in 2 weeks.  Vanetta Mulders - Systems analyst trimestre (Pregnancy - Third Trimester) El tercer trimestre del embarazo (los ltimos 3 meses) es el perodo de cambios ms rpidos que atraviesan usted y el beb. El aumento de peso es ms rpido. El beb alcanza un largo de aproximadamente 50 cm (20 pulgadas) y pesa entre 2,700 y 4,500 kg (6 a 10 libras). El beb gana ms tejido graso y ya est listo para la vida fuera del cuerpo de la [place]. Mientras estn en el interior, los bebs tienen perodos de sueo y vigilia, Warehouse manager y tienen hipo. Quizs sienta pequeas contracciones del tero. Este es el falso trabajo de [place]. Tambin se las conoce como contracciones de Braxton-Hicks. Es como una prctica del parto. Los problemas ms habituales de esta etapa del embarazo incluyen mayor dificultad para respirar, hinchazn de las manos y los pies por retencin de lquidos y la necesidad de Geographical information systems officer con ms frecuencia debido a que el tero y el beb presionan sobre la vejiga.  EXAMENES PRENATALES  Durante los [employer], deber seguir realizando pruebas de [place], segn avance el [place]. Estas pruebas se realizan para controlar su salud y la del beb. Tambin se realizan anlisis de sangre para [employer] niveles de [place]. La anemia (bajo nivel de hemoglobina) es frecuente durante el embarazo. Para prevenirla, se administran hierro y vitaminas. Tambin le harn nuevas pruebas para descartar la diabetes. Podrn repetirle algunas de las [employer] hicieron previamente.   En cada visita le medirn el tamao del tero. Es para asegurarse de que el beb se desarrolla correctamente.   Tambin en cada visita la pesarn. Esto se realiza para asegurarse de que aumenta de peso al ritmo indicado y que usted y su beb evolucionan normalmente.   En algunas ocasiones se realiza  una ecografa para confirmar el correcto desarrollo y evolucin del beb. Esta prueba se realiza con ondas sonoras inofensivas para el beb, de modo que el profesional pueda calcular con ms precisin la fecha del [place].   Discuta las posibilidades de la anestesia si necesita cesrea.  Algunas veces se realizan pruebas especializadas del lquido amnitico que rodea al beb. Esta prueba se denomina amniocentesis. El lquido amnitico se obtiene introduciendo una aguja en el abdomen (vientre). En ocasiones se lleva a cabo cerca del final del embarazo, si es Optician, dispensing. En este caso se realiza para asegurarse de que los pulmones del beb estn lo suficientemente maduros como para que pueda vivir fuera del tero. CAMBIOS QUE OCURREN EN EL TERCER TRIMESTRE DEL EMBARAZO Su organismo atravesar diferentes cambios durante el embarazo que varan de Neomia Dear persona a Educational psychologist. Converse con el profesional que la asiste acerca los cambios que usted note y que la preocupen.  Durante el ltimo trimestre probablemente sienta un aumento del apetito. Es normal tener "antojos" de Development worker, community. Esto vara de Neomia Dear persona a otra y de un embarazo a Therapist, art.   Podrn aparecer las primeras estras en las caderas, abdomen y [place]. Estos son cambios normales del cuerpo durante el [place]. No existen medicamentos ni ejercicios que puedan prevenir [employer].   El estreimiento puede tratarse con un laxante o agregando fibra a su dieta. Beber grandes cantidades de lquidos, tomar fibras en forma de verduras, frutas y granos integrales es de [place].   Tambin es beneficioso practicar actividad fsica.  Si ha sido una persona Engineer, mining, podr continuar con la [employer] de las actividades durante el mismo. Si ha sido [employer], puede ser beneficioso que comience con un programa de ejercicios, Museum/gallery exhibitions officer. Consulte con el profesional que la asiste antes de comenzar un programa de  ejercicios.   Evite el consumo de cigarrillos, el alcohol, los medicamentos no prescritos y las "drogas de la calle" durante el [place]. Estas sustancias qumicas afectan la formacin y el desarrollo del beb. Evite estas sustancias durante todo el embarazo para asegurar el nacimiento de un beb sano.   Dolor de espalda, venas varicosas y hemorroides podran aparecer o empeorar.   Los movimientos del beb pueden ser ms bruscos y aparecer ms a menudo.   Puede que note dificultades para respirar facilmente.   El ombligo podra salrsele hacia afuera.   Puede segregar un lquido amarillento (calostro) de las [place].   Puede segregar mucus con sangre. Esto normalmente ocurre unos [address] a una semana antes de que comience el [place] de [place].  INSTRUCCIONES PARA EL CUIDADO DOMICILIARIO  La mayor parte de los cuidados que se aconsejan son los mismos que los indicados para las primeras etapas del Psychiatrist. Es importante que concurra a todas las citas con el profesional y siga sus instrucciones con Camera operator a los medicamentos que deba Chemical engineer, a la actividad fsica y a Psychologist, forensic.   Durante el embarazo debe obtener nutrientes para usted y para su beb. Consuma alimentos balanceados a intervalos regulares. Elija alimentos como carne, pescado, [place] y otros productos lcteos descremados, verduras, frutas, panes integrales y cereales. El Equities trader cul es el aumento de peso ideal.   Las relaciones sexuales pueden continuarse hasta casi el final del embarazo, si no se presentan otros problemas como prdida prematura (antes de tiempo) de lquido amnitico, hemorragia vaginal o dolor abdominal (en el vientre).   Realice [employer], si no tiene restricciones. Consulte con el profesional que la asiste si no sabe con certeza si determinados ejercicios son seguros. El mayor aumento de peso se produce [employer] ltimos trimestres del [place].   Haga reposo con  frecuencia, con las piernas elevadas, o segn lo necesite para evitar los calambres y el dolor de cintura.   Use un buen sostn o como los que se usan para hacer deportes para Paramedic la sensibilidad de las [place]. Tambin puede serle til si lo [place] mientras duerme. Si pierde Product manager, podr [employer].   No utilice la baera con agua caliente, baos turcos y saunas.   Colquese el cinturn de seguridad cuando conduzca. Este la proteger a usted y al beb en caso de accidente.   Evite comer carne cruda y el contacto con los utensilios y desperdicios de los gatos. Estos elementos contienen grmenes que pueden causar defectos de nacimiento en el beb.   Es fcil perder algo de orina durante el [place]. Apretar y Chief Operating Officer los msculos de la pelvis la ayudar con este problema. Practique detener la miccin cuando est en el bao. Estos son los mismos msculos que Development worker, international aid. Son [employer] mismos msculos que utiliza cuando trata de [employer] gases. Puede practicar apretando estos msculos [employer], y repetir esto tres veces por da aproximadamente. Una vez que conozca qu msculos debe contraer, no realice estos ejercicios durante la miccin. Puede favorecerle una infeccin si la orina vuelve hacia atrs.   Pida ayuda si tiene necesidades econmicas, de asesoramiento o  nutricionales durante el embarazo. El profesional podr ayudarla con respecto a estas necesidades, o derivarla a otros especialistas.   Practique la ida [employer] hospital a modo de [place].   Tome clases prenatales junto con su pareja para comprender, practicar y hacer preguntas acerca del Aleen Campi de parto y el nacimiento.   Prepare la habitacin del beb.   No viaje fuera de la ciudad a menos que sea absolutamente necesario y con el consejo del mdico.   Use slo zapatos bajos sin taco para tener un mejor equilibrio y prevenir cadas.  EL CONSUMO DE MEDICAMENTOS Y DROGAS DURANTE EL  EMBARAZO  Contine tomando las vitaminas apropiadas para esta etapa tal como se le indic. Las vitaminas deben contener un miligramo de cido flico y deben suplementarse con hierro. Guarde todas las vitaminas fuera del alcance de los nios. La ingestin de slo un par de vitaminas o comprimidos que contengan hierro pueden ocasionar la [employer] en un beb o en un nio pequeo.   Evite el uso de [place], inclusive los de venta [place], que no hayan sido prescritos o indicados por el profesional que la asiste. Algunos medicamentos pueden causar problemas fsicos al beb. Utilice los medicamentos de venta libre o de prescripcin para Chief Technology Officer, Environmental health practitioner o la [place], segn se lo indique el profesional que lo asiste. No utilice aspirina, ibuprofeno (Motrin, Advil, Nuprin) o naproxeno (Aleve) a menos que el profesional la autorice.   El alcohol se asocia a cierto nmero de defectos del nacimiento, incluido el sndrome de alcoholismo fetal. Debe evitar el consumo de alcohol en cualquiera de sus formas. El cigarrillo causa nacimientos prematuros y bebs de bajo peso al nacer. Las drogas de la calle son muy nocivas para el beb y estn absolutamente prohibidas. Un beb que nace de [employer], ser adicto al nacer. Ese beb tendr los mismos sntomas de abstinencia que un adulto.   Infrmele al profesional si consume alguna droga.  SOLICITE ATENCIN MDICA SI: Tiene alguna preocupacin Academic librarian. Es mejor que llame para formular las preguntas si no puede esperar hasta la prxima visita, que sentirse preocupada por ellas.  DECISIONES ACERCA DE LA CIRCUNCISIN Usted puede saber o no cul es el sexo de su beb. Si es un varn, ste es el momento de pensar acerca de la circuncisin. La circuncisin es la extirpacin del prepucio. Esta es la piel que cubre el extremo sensible del pene. No hay un motivo mdico que lo justifique. Generalmente la decisin se toma segn lo que sea popular en ese  momento, o se basa en creencias religiosas. Podr conversar estos temas con el profesional que la asiste. SOLICITE ATENCIN MDICA DE INMEDIATO SI:  La temperatura oral se eleva sin motivo por encima de 102 F (38.9 C) o segn le indique el profesional que la asiste.   Tiene una prdida de lquido por la vagina (canal de parto). Si sospecha una ruptura de las [place], tmese la temperatura y llame al profesional para informarlo sobre esto.   Observa unas pequeas manchas, una hemorragia vaginal o elimina cogulos. Avsele al profesional acerca de la cantidad y de cuntos apsitos est utilizando.   Presenta un olor desagradable en la secrecin vaginal y observa un cambio en el color, de transparente a blanco.   Ha vomitado durante ms de 24 horas.   Presenta escalofros o fiebre.   Comienza a sentir falta de aire.   Siente ardor al Beatrix Shipper.   [place] o sube ms de 900 g (  ms de 2 libras), o segn lo indicado por el profesional que la asiste. Observa que sbitamente se le hinchan el rostro, las manos, los pies o las piernas.   Presenta dolor abdominal. Las molestias en el ligamento redondo son Neomia Dear causa benigna (no cancerosa) frecuente de Engineer, mining abdominal durante el Psychiatrist, pero el profesional que la asiste deber evaluarlo.   Presenta dolor de cabeza intenso que no se [place].   Si no siente los movimientos del beb durante ms de tres horas. Si piensa que el beb no se mueve tanto como lo haca habitualmente, coma algo que Psychologist, clinical y [employer] lado izquierdo durante [place]. El beb debe moverse al menos 4  5 veces por hora. Comunquese inmediatamente si el beb se mueve menos que lo indicado.   Se cae, se ve involucrada en un accidente automovilstico o sufre algn tipo de traumatismo.   En su hogar hay violencia mental o fsica.  Document Released: 06/03/2005 Document Revised: 08/13/2011 [facility] Patient Information 2012 [place], [place].

## 2012-04-07 NOTE — Progress Notes (Signed)
No complaints today. Denies vaginal bleeding/disch/LOF, HA, vision change, RUQ pain, contractions.  Good Kick counts Occasional LE edema after long periods of time on feet and relieved w/ rest. Present w/ other pregnancies Reviewed labor precautions Discussed reasons for MAU visit Handout given 1hr glucola today of 149 will need 3hr glucola

## 2012-04-11 ENCOUNTER — Other Ambulatory Visit: Payer: Self-pay

## 2012-04-11 DIAGNOSIS — Z331 Pregnant state, incidental: Secondary | ICD-10-CM

## 2012-04-11 NOTE — Progress Notes (Signed)
3 HR GTT DONE TODAY Tina Pruitt 

## 2012-04-12 LAB — GLUCOSE TOLERANCE, 3 HOURS: Glucose Tolerance, 2 hour: 100 mg/dL (ref 70–164)

## 2012-04-19 ENCOUNTER — Ambulatory Visit (INDEPENDENT_AMBULATORY_CARE_PROVIDER_SITE_OTHER): Payer: Self-pay | Admitting: Family Medicine

## 2012-04-19 DIAGNOSIS — Z3480 Encounter for supervision of other normal pregnancy, unspecified trimester: Secondary | ICD-10-CM

## 2012-04-19 NOTE — Patient Instructions (Addendum)
Embarazo - Tercer trimestre (Pregnancy - Third Trimester) El tercer trimestre del embarazo (los ltimos 3 meses) es el perodo de cambios ms rpidos que atraviesan usted y el beb. El aumento de peso es ms rpido. El beb alcanza un largo de aproximadamente 50 cm (20 pulgadas) y pesa entre 2,700 y 4,500 kg (6 a 10 libras). El beb gana ms tejido graso y ya est listo para la vida fuera del cuerpo de la madre. Mientras estn en el interior, los bebs tienen perodos de sueo y vigilia, succionan el pulgar y tienen hipo. Quizs sienta pequeas contracciones del tero. Este es el falso trabajo de parto. Tambin se las conoce como contracciones de Braxton-Hicks. Es como una prctica del parto. Los problemas ms habituales de esta etapa del embarazo incluyen mayor dificultad para respirar, hinchazn de las manos y los pies por retencin de lquidos y la necesidad de orinar con ms frecuencia debido a que el tero y el beb presionan sobre la vejiga.  EXAMENES PRENATALES  Durante los exmenes prenatales, deber seguir realizando pruebas de sangre, segn avance el embarazo. Estas pruebas se realizan para controlar su salud y la del beb. Tambin se realizan anlisis de sangre para conocer los niveles de hemoglobina. La anemia (bajo nivel de hemoglobina) es frecuente durante el embarazo. Para prevenirla, se administran hierro y vitaminas. Tambin le harn nuevas pruebas para descartar la diabetes. Podrn repetirle algunas de las pruebas que le hicieron previamente.   En cada visita le medirn el tamao del tero. Es para asegurarse de que el beb se desarrolla correctamente.   Tambin en cada visita la pesarn. Esto se realiza para asegurarse de que aumenta de peso al ritmo indicado y que usted y su beb evolucionan normalmente.   En algunas ocasiones se realiza una ecografa para confirmar el correcto desarrollo y evolucin del beb. Esta prueba se realiza con ondas sonoras inofensivas para el beb, de modo  que el profesional pueda calcular con ms precisin la fecha del parto.   Discuta las posibilidades de la anestesia si necesita cesrea.  Algunas veces se realizan pruebas especializadas del lquido amnitico que rodea al beb. Esta prueba se denomina amniocentesis. El lquido amnitico se obtiene introduciendo una aguja en el abdomen (vientre). En ocasiones se lleva a cabo cerca del final del embarazo, si es necesario adelantar el parto. En este caso se realiza para asegurarse de que los pulmones del beb estn lo suficientemente maduros como para que pueda vivir fuera del tero. CAMBIOS QUE OCURREN EN EL TERCER TRIMESTRE DEL EMBARAZO Su organismo atravesar diferentes cambios durante el embarazo que varan de una persona a otra. [place] con el profesional que la asiste acerca los cambios que usted note y que la preocupen.  Durante el ltimo trimestre probablemente sienta un aumento del apetito. Es normal tener "antojos" de ciertas comidas. Esto vara de una persona a otra y de un embarazo a otro.   Podrn aparecer las primeras estras en las caderas, abdomen y mamas. Estos son cambios normales del cuerpo durante el embarazo. No existen medicamentos ni ejercicios que puedan prevenir estos cambios.   El estreimiento puede tratarse con un laxante o agregando fibra a su dieta. Beber grandes cantidades de lquidos, tomar fibras en forma de verduras, frutas y granos integrales es de gran ayuda.   Tambin es beneficioso practicar actividad fsica. Si ha sido una persona activa hasta el embarazo, podr continuar con la mayora de las actividades durante el mismo. Si ha sido menos activa, puede ser beneficioso   que comience con un programa de ejercicios, como realizar caminatas. Consulte con el profesional que la asiste antes de comenzar un programa de ejercicios.   Evite el consumo de cigarrillos, el alcohol, los medicamentos no prescritos y las "drogas de la calle" durante el embarazo. Estas sustancias  qumicas afectan la formacin y el desarrollo del beb. Evite estas sustancias durante todo el embarazo para asegurar el nacimiento de un beb sano.   Dolor de espalda, venas varicosas y hemorroides podran aparecer o empeorar.   Los movimientos del beb pueden ser ms bruscos y aparecer ms a menudo.   Puede que note dificultades para respirar facilmente.   El ombligo podra salrsele hacia afuera.   Puede segregar un lquido amarillento (calostro) de las mamas.   Puede segregar mucus con sangre. Esto normalmente ocurre unos pocos das a una semana antes de que comience el trabajo de parto.  INSTRUCCIONES PARA EL CUIDADO DOMICILIARIO  La mayor parte de los cuidados que se aconsejan son los mismos que los indicados para las primeras etapas del embarazo. Es importante que concurra a todas las citas con el profesional y siga sus instrucciones con respecto a los medicamentos que deba utilizar, a la actividad fsica y a la dieta.   Durante el embarazo debe obtener nutrientes para usted y para su beb. Consuma alimentos balanceados a intervalos regulares. Elija alimentos como carne, pescado, leche y otros productos lcteos descremados, verduras, frutas, panes integrales y cereales. El profesional le informar cul es el aumento de peso ideal.   Las relaciones sexuales pueden continuarse hasta casi el final del embarazo, si no se presentan otros problemas como prdida prematura (antes de tiempo) de lquido amnitico, hemorragia vaginal o dolor abdominal (en el vientre).   Realice actividad fsica todos los das, si no tiene restricciones. Consulte con el profesional que la asiste si no sabe con certeza si determinados ejercicios son seguros. El mayor aumento de peso se produce en los dos ltimos trimestres del embarazo.   Haga reposo con frecuencia, con las piernas elevadas, o segn lo necesite para evitar los calambres y el dolor de cintura.   Use un buen sostn o como los que se usan para hacer  deportes para aliviar la sensibilidad de las mamas. Tambin puede serle til si lo usa mientras duerme. Si pierde calostro, podr utilizar apsitos en el sostn.   No utilice la baera con agua caliente, baos turcos y saunas.   Colquese el cinturn de seguridad cuando conduzca. Este la proteger a usted y al beb en caso de accidente.   Evite comer carne cruda y el contacto con los utensilios y desperdicios de los gatos. Estos elementos contienen grmenes que pueden causar defectos de nacimiento en el beb.   Es fcil perder algo de orina durante el embarazo. Apretar y fortalecer los msculos de la pelvis la ayudar con este problema. Practique detener la miccin cuando est en el bao. Estos son los mismos msculos que necesita fortalecer. Son tambin los mismos msculos que utiliza cuando trata de evitar los gases. Puede practicar apretando estos msculos diez veces, y repetir esto tres veces por da aproximadamente. Una vez que conozca qu msculos debe contraer, no realice estos ejercicios durante la miccin. Puede favorecerle una infeccin si la orina vuelve hacia atrs.   Pida ayuda si tiene necesidades econmicas, de asesoramiento o nutricionales durante el embarazo. El profesional podr ayudarla con respecto a estas necesidades, o derivarla a otros especialistas.   Practique la ida hasta el hospital a modo   de prueba.   Tome clases prenatales junto con su pareja para comprender, practicar y hacer preguntas acerca del trabajo de parto y el nacimiento.   Prepare la habitacin del beb.   No viaje fuera de la ciudad a menos que sea absolutamente necesario y con el consejo del mdico.   Use slo zapatos bajos sin taco para tener un mejor equilibrio y prevenir cadas.  EL CONSUMO DE MEDICAMENTOS Y DROGAS DURANTE EL EMBARAZO  Contine tomando las vitaminas apropiadas para esta etapa tal como se le indic. Las vitaminas deben contener un miligramo de cido flico y deben suplementarse con  hierro. Guarde todas las vitaminas fuera del alcance de los nios. La ingestin de slo un par de vitaminas o comprimidos que contengan hierro pueden ocasionar la muerte en un beb o en un nio pequeo.   Evite el uso de medicamentos, inclusive los de venta libre, que no hayan sido prescritos o indicados por el profesional que la asiste. Algunos medicamentos pueden causar problemas fsicos al beb. Utilice los medicamentos de venta libre o de prescripcin para el dolor, el malestar o la fiebre, segn se lo indique el profesional que lo asiste. No utilice aspirina, ibuprofeno (Motrin, Advil, Nuprin) o naproxeno (Aleve) a menos que el profesional la autorice.   El alcohol se asocia a cierto nmero de defectos del nacimiento, incluido el sndrome de alcoholismo fetal. Debe evitar el consumo de alcohol en cualquiera de sus formas. El cigarrillo causa nacimientos prematuros y bebs de bajo peso al nacer. Las drogas de la calle son muy nocivas para el beb y estn absolutamente prohibidas. Un beb que nace de una madre adicta, ser adicto al nacer. Ese beb tendr los mismos sntomas de abstinencia que un adulto.   Infrmele al profesional si consume alguna droga.  SOLICITE ATENCIN MDICA SI: Tiene alguna preocupacin durante el embarazo. Es mejor que llame para formular las preguntas si no puede esperar hasta la prxima visita, que sentirse preocupada por ellas.  DECISIONES ACERCA DE LA CIRCUNCISIN Usted puede saber o no cul es el sexo de su beb. Si es un varn, ste es el momento de pensar acerca de la circuncisin. La circuncisin es la extirpacin del prepucio. Esta es la piel que cubre el extremo sensible del pene. No hay un motivo mdico que lo justifique. Generalmente la decisin se toma segn lo que sea popular en ese momento, o se basa en creencias religiosas. Podr conversar estos temas con el profesional que la asiste. SOLICITE ATENCIN MDICA DE INMEDIATO SI:  La temperatura oral se eleva  sin motivo por encima de 102 F (38.9 C) o segn le indique el profesional que la asiste.   Tiene una prdida de lquido por la vagina (canal de parto). Si sospecha una ruptura de las membranas, tmese la temperatura y llame al profesional para informarlo sobre esto.   Observa unas pequeas manchas, una hemorragia vaginal o elimina cogulos. Avsele al profesional acerca de la cantidad y de cuntos apsitos est utilizando.   Presenta un olor desagradable en la secrecin vaginal y observa un cambio en el color, de transparente a blanco.   Ha vomitado durante ms de 24 horas.   Presenta escalofros o fiebre.   Comienza a sentir falta de aire.   Siente ardor al orinar.   [place] o sube ms de 900 g (ms de 2 libras), o segn lo indicado por el profesional que la asiste. Observa que sbitamente se le hinchan el rostro, las manos, los pies o las   piernas.   Presenta dolor abdominal. Las molestias en el ligamento redondo son una causa benigna (no cancerosa) frecuente de dolor abdominal durante el embarazo, pero el profesional que la asiste deber evaluarlo.   Presenta dolor de cabeza intenso que no se alivia.   Si no siente los movimientos del beb durante ms de tres horas. Si piensa que el beb no se mueve tanto como lo haca habitualmente, coma algo que contenga azcar y recustese sobre el lado izquierdo durante una hora. El beb debe moverse al menos 4  5 veces por hora. Comunquese inmediatamente si el beb se mueve menos que lo indicado.   Se cae, se ve involucrada en un accidente automovilstico o sufre algn tipo de traumatismo.   En su hogar hay violencia mental o fsica.  Document Released: 06/03/2005 Document Revised: 08/13/2011 [employer] Patient Information 2012 [employer]. 

## 2012-04-20 NOTE — Progress Notes (Signed)
No complaints No LE edema Denies vaginal bleeding/discharge/lof, HA, vision change, RUQ pain, contractions Kick counts appropriate.  Reviewed labor precautions Handout given 3hr glucola normal Return in 2 weeks

## 2012-05-03 ENCOUNTER — Ambulatory Visit (INDEPENDENT_AMBULATORY_CARE_PROVIDER_SITE_OTHER): Payer: Self-pay | Admitting: Family Medicine

## 2012-05-03 DIAGNOSIS — Z3480 Encounter for supervision of other normal pregnancy, unspecified trimester: Secondary | ICD-10-CM

## 2012-05-03 NOTE — Progress Notes (Signed)
Doing well. No complaints Deniesvaginal bleeding/discharge/lof, HA, vision change, RUQ pain, contractions  Kick counts appropriate.  Reviewed labor precautions  Handout given  Return in 2 weeks to [facility] and in 4 wks to my clnic Plans for [facility] for Pediatrician, will breast feed, no circumcision

## 2012-05-03 NOTE — Patient Instructions (Addendum)
Embarazo - Tercer trimestre (Pregnancy - Third Trimester) El tercer trimestre del embarazo (los ltimos 3 meses) es el perodo de cambios ms rpidos que atraviesan usted y el beb. El aumento de peso es ms rpido. El beb alcanza un largo de aproximadamente 50 cm (20 pulgadas) y pesa entre 2,700 y 4,500 kg (6 a 10 libras). El beb gana ms tejido graso y ya est listo para la vida fuera del cuerpo de la madre. Mientras estn en el interior, los bebs tienen perodos de sueo y vigilia, succionan el pulgar y tienen hipo. Quizs sienta pequeas contracciones del tero. Este es el falso trabajo de parto. Tambin se las conoce como contracciones de Braxton-Hicks. Es como una prctica del parto. Los problemas ms habituales de esta etapa del embarazo incluyen mayor dificultad para respirar, hinchazn de las manos y los pies por retencin de lquidos y la necesidad de orinar con ms frecuencia debido a que el tero y el beb presionan sobre la vejiga.  EXAMENES PRENATALES  Durante los exmenes prenatales, deber seguir realizando pruebas de sangre, segn avance el embarazo. Estas pruebas se realizan para controlar su salud y la del beb. Tambin se realizan anlisis de sangre para conocer los niveles de hemoglobina. La anemia (bajo nivel de hemoglobina) es frecuente durante el embarazo. Para prevenirla, se administran hierro y vitaminas. Tambin le harn nuevas pruebas para descartar la diabetes. Podrn repetirle algunas de las pruebas que le hicieron previamente.   En cada visita le medirn el tamao del tero. Es para asegurarse de que el beb se desarrolla correctamente.   Tambin en cada visita la pesarn. Esto se realiza para asegurarse de que aumenta de peso al ritmo indicado y que usted y su beb evolucionan normalmente.   En algunas ocasiones se realiza una ecografa para confirmar el correcto desarrollo y evolucin del beb. Esta prueba se realiza con ondas sonoras inofensivas para el beb, de modo  que el profesional pueda calcular con ms precisin la fecha del parto.   Discuta las posibilidades de la anestesia si necesita cesrea.  Algunas veces se realizan pruebas especializadas del lquido amnitico que rodea al beb. Esta prueba se denomina amniocentesis. El lquido amnitico se obtiene introduciendo una aguja en el abdomen (vientre). En ocasiones se lleva a cabo cerca del final del embarazo, si es necesario adelantar el parto. En este caso se realiza para asegurarse de que los pulmones del beb estn lo suficientemente maduros como para que pueda vivir fuera del tero. CAMBIOS QUE OCURREN EN EL TERCER TRIMESTRE DEL EMBARAZO Su organismo atravesar diferentes cambios durante el embarazo que varan de una persona a otra. Converse con el profesional que la asiste acerca los cambios que usted note y que la preocupen.  Durante el ltimo trimestre probablemente sienta un aumento del apetito. Es normal tener "antojos" de ciertas comidas. Esto vara de una persona a otra y de un embarazo a otro.   Podrn aparecer las primeras estras en las caderas, abdomen y mamas. Estos son cambios normales del cuerpo durante el embarazo. No existen medicamentos ni ejercicios que puedan prevenir estos cambios.   El estreimiento puede tratarse con un laxante o agregando fibra a su dieta. Beber grandes cantidades de lquidos, tomar fibras en forma de verduras, frutas y granos integrales es de gran ayuda.   Tambin es beneficioso practicar actividad fsica. Si ha sido una persona activa hasta el embarazo, podr continuar con la mayora de las actividades durante el mismo. Si ha sido menos activa, puede ser beneficioso   que comience con un programa de ejercicios, como Pension scheme manager. Consulte con el profesional que la asiste antes de comenzar un programa de ejercicios.   Evite el consumo de cigarrillos, el alcohol, los medicamentos no prescritos y las "drogas de la calle" durante el [place]. Estas sustancias  qumicas afectan la formacin y el desarrollo del beb. Evite estas sustancias durante todo el embarazo para asegurar el nacimiento de un beb sano.   Dolor de espalda, venas varicosas y hemorroides podran aparecer o empeorar.   Los movimientos del beb pueden ser ms bruscos y aparecer ms a menudo.   Puede que note dificultades para respirar facilmente.   El ombligo podra salrsele hacia afuera.   Puede segregar un lquido amarillento (calostro) de las Universal.   Puede segregar mucus con sangre. Esto normalmente ocurre unos pocos das a una semana antes de que comience el [place].  Bow[place] parte de los cuidados que se aconsejan son los mismos que los indicados para las primeras etapas del Media planner. Es importante que concurra a todas las citas con el profesional y siga sus instrucciones con Engineer, site a los medicamentos que deba Risk manager, a la actividad fsica y a Engineer, technical sales.   Durante el embarazo debe obtener nutrientes para usted y para su beb. Consuma alimentos balanceados a intervalos regulares. Elija alimentos como carne, pescado, [place] y otros productos lcteos descremados, verduras, frutas, panes integrales y cereales. El Conservation officer, nature cul es el aumento de peso ideal.   Las relaciones sexuales pueden continuarse hasta casi el final del embarazo, si no se presentan otros problemas como prdida prematura (antes de tiempo) de lquido amnitico, hemorragia vaginal o dolor abdominal (en el vientre).   [place], si no tiene restricciones. Consulte con el profesional que la asiste si no sabe con certeza si determinados ejercicios son seguros. El mayor aumento de peso se produce [place] ltimos trimestres del [place].   Haga reposo con frecuencia, con las piernas elevadas, o segn lo necesite para evitar los calambres y el dolor de cintura.   Use un buen sostn o como los que se usan para hacer  deportes para Public house manager la sensibilidad de las [place]. Tambin puede serle til si lo [place] mientras duerme. Si pierde Adult nurse, podr [employer].   No utilice la baera con agua caliente, baos turcos y saunas.   Colquese el cinturn de seguridad cuando conduzca. Este la proteger a usted y al beb en caso de accidente.   Evite comer carne cruda y el contacto con los utensilios y desperdicios de los gatos. Estos elementos contienen grmenes que pueden causar defectos de nacimiento en el beb.   Es fcil perder algo de orina durante el [place]. Apretar y Software engineer los msculos de la pelvis la ayudar con este problema. Practique detener la miccin cuando est en el bao. Estos son los mismos msculos que Advertising copywriter. Son [employer] mismos msculos que utiliza cuando trata de [employer] [place] gases. Puede practicar apretando estos msculos [employer], y repetir esto tres veces por da aproximadamente. Una vez que conozca qu msculos debe contraer, no realice estos ejercicios durante la miccin. Puede favorecerle una infeccin si la orina vuelve hacia atrs.   Pida ayuda si tiene necesidades econmicas, de asesoramiento o nutricionales durante el [place]. El profesional podr ayudarla con respecto a estas necesidades, o derivarla a otros especialistas.   Practique la ida [employer] hospital a modo  de prueba.   Tome clases prenatales junto con su pareja para comprender, practicar y hacer preguntas acerca del [place] de parto y el nacimiento.   Prepare la habitacin del beb.   No viaje fuera de la ciudad a menos que sea absolutamente necesario y con el consejo del mdico.   Use slo zapatos bajos sin taco para tener un mejor equilibrio y prevenir cadas.  EL CONSUMO DE MEDICAMENTOS Y DROGAS DURANTE EL EMBARAZO  Contine tomando las vitaminas apropiadas para esta etapa tal como se le indic. Las vitaminas deben contener un miligramo de cido flico y deben suplementarse con  hierro. Guarde todas las vitaminas fuera del alcance de los nios. La ingestin de slo un par de vitaminas o comprimidos que contengan hierro pueden ocasionar la [employer] en un beb o en un nio pequeo.   Evite el uso de [place], inclusive los de venta [place], que no hayan sido prescritos o indicados por el profesional que la asiste. Algunos medicamentos pueden causar problemas fsicos al beb. Utilice los medicamentos de venta libre o de prescripcin para Conservation officer, historic buildings, Health and safety inspector o la [place], segn se lo indique el profesional que lo asiste. No utilice aspirina, ibuprofeno (Motrin, Advil, Nuprin) o naproxeno (Aleve) a menos que el profesional la autorice.   El alcohol se asocia a cierto nmero de defectos del nacimiento, incluido el sndrome de alcoholismo fetal. Debe evitar el consumo de alcohol en cualquiera de sus formas. El cigarrillo causa nacimientos prematuros y bebs de bajo peso al nacer. Las drogas de la calle son muy nocivas para el beb y estn absolutamente prohibidas. Un beb que nace de [employer], ser adicto al nacer. Ese beb tendr los mismos sntomas de abstinencia que un adulto.   Infrmele al profesional si consume alguna droga.  SOLICITE ATENCIN MDICA SI: Tiene alguna preocupacin Solicitor. Es mejor que llame para formular las preguntas si no puede esperar hasta la prxima visita, que sentirse preocupada por ellas.  [place] o no cul es el sexo de su beb. Si es un varn, ste es el momento de pensar acerca de la circuncisin. La circuncisin es la extirpacin del prepucio. Esta es la piel que cubre el extremo sensible del pene. No hay un motivo mdico que lo justifique. Generalmente la decisin se toma segn lo que sea popular en ese momento, o se basa en creencias religiosas. Podr conversar estos temas con el profesional que la asiste. SOLICITE ATENCIN MDICA DE INMEDIATO SI:  La temperatura oral se eleva  sin motivo por encima de 5 F (38.9 C) o segn le indique el profesional que la asiste.   Tiene una prdida de lquido por la vagina (canal de parto). Si sospecha una ruptura de las [place], tmese la temperatura y llame al profesional para informarlo sobre esto.   Observa unas pequeas manchas, una hemorragia vaginal o elimina cogulos. Avsele al profesional acerca de la cantidad y de cuntos apsitos est utilizando.   Presenta un olor desagradable en la secrecin vaginal y observa un cambio en el color, de transparente a blanco.   Ha vomitado durante ms de 24 horas.   Presenta escalofros o fiebre.   Comienza a sentir falta de [place].   Siente ardor al [place].   [place] o sube ms de 900 g (ms de 2 libras), o segn lo indicado por el profesional que la asiste. Observa que sbitamente se le [employer], las manos, los pies o las  piernas.   Presenta dolor abdominal. Las molestias en el ligamento redondo son Neomia Dear causa benigna (no cancerosa) frecuente de Engineer, mining abdominal durante el Psychiatrist, pero el profesional que la asiste deber evaluarlo.   Presenta dolor de cabeza intenso que no se [place].   Si no siente los movimientos del beb durante ms de tres horas. Si piensa que el beb no se mueve tanto como lo haca habitualmente, coma algo que Psychologist, clinical y [employer] Corporation lado izquierdo durante [place]. El beb debe moverse al menos 4  5 veces por hora. Comunquese inmediatamente si el beb se mueve menos que lo indicado.   Se cae, se ve involucrada en un accidente automovilstico o sufre algn tipo de traumatismo.   En su hogar hay violencia mental o fsica.  Document Released: 06/03/2005 Document Revised: 08/13/2011 [facility] Patient Information 2012 [place], [place].Mtodo para contar los movimientos fetales (Fetal Movement Counts) Nombre de la paciente: __________________________________________________ Franco Nones probable de parto:____________________ En los embarazos de  alto riesgo se recomienda contar las pataditas, pero tambin es una buena idea que lo hagan todas las [place]. Comience a contarlas a las 28 semanas de embarazo. Los movimientos fetales aumentan luego de una comida Immunologist o de comer o beber algo dulce (el nivel de azcar en la sangre est ms alto). Tambin es importante beber gran cantidad de lquidos (hidratarse bien) antes de contar. Si se recuesta sobre el lado izquierdo mejorar la Designer, industrial/product, o puede sentarse en una silla cmoda con los brazos sobre el abdomen y sin distracciones que la rodeen. CONTANDO  Trate de contar a la [employer] lo haga.   Marque el da y la hora y vea cunto le lleva sentir 10 movimientos (patadas, agitaciones, sacudones, vueltas). Debe sentir al menos 10 movimientos en 2 horas. Probablemente sienta los 10 movimientos en menos de dos horas. Si no los siente, espere una hora y cuente nuevamente. Luego de [employer] tendr un patrn.   Debemos observar si hay cambios en el patrn o no hay suficientes pataditas en 2 horas. Le lleva ms tiempo contar los 10 movimientos?  SOLICITE ATENCIN MDICA SI:  Siente menos de 10 pataditas en 2 horas. Intntelo dos veces.   No siente movimientos durante 1 hora.   El patrn se modifica o le lleva ms tiempo Art gallery manager las 10 pataditas.   Siente que el beb no se mueve como lo hace habitualmente.  Fecha: ____________ Movimientos: ____________ Comienzo hora: ____________ Cephas Darby: ____________ Franco Nones: ____________ Movimientos: ____________ Comienzo hora: ____________ Cephas Darby: ____________ Franco Nones: ____________ Movimientos: ____________ Comienzo hora: ____________ Cephas Darby: ____________ Franco Nones: ____________ Movimientos: ____________ Comienzo hora: ____________ Cephas Darby: ____________ Franco Nones: ____________ Movimientos: ____________ Comienzo hora: ____________ Cephas Darby: ____________ Franco Nones: ____________ Movimientos: ____________ Comienzo hora: ____________  Cephas Darby: ____________ Franco Nones: ____________ Movimientos: ____________ Comienzo hora: ____________ Cephas Darby: ____________  Franco Nones: ____________ Movimientos: ____________ Comienzo hora: ____________ Cephas Darby: ____________ Franco Nones: ____________ Movimientos: ____________ Comienzo hora: ____________ Cephas Darby: ____________ Franco Nones: ____________ Movimientos: ____________ Comienzo hora: ____________ Cephas Darby: ____________ Franco Nones: ____________ Movimientos: ____________ Comienzo hora: ____________ Cephas Darby: ____________ Franco Nones: ____________ Movimientos: ____________ Comienzo hora: ____________ Cephas Darby: ____________ Franco Nones: ____________ Movimientos: ____________ Comienzo hora: ____________ Cephas Darby: ____________ Franco Nones: ____________ Movimientos: ____________ Comienzo hora: ____________ Cephas Darby: ____________  Franco Nones: ____________ Movimientos: ____________ Comienzo hora: ____________ Cephas Darby: ____________ Franco Nones: ____________ Movimientos: ____________ Comienzo hora: ____________ Cephas Darby: ____________ Franco Nones: ____________ Movimientos: ____________ Comienzo hora: ____________ Cephas Darby: ____________ Franco Nones: ____________ Movimientos: ____________ Comienzo hora: ____________ Daylene Posey  hora: ____________ Franco Nones: ____________ Movimientos: ____________ Comienzo hora: ____________ Cephas Darby: ____________ Franco Nones: ____________ Movimientos: ____________ Comienzo hora: ____________ Cephas Darby: ____________ Franco Nones: ____________ Movimientos: ____________ Comienzo hora: ____________ Cephas Darby: ____________  Franco Nones: ____________ Movimientos: ____________ Comienzo hora: ____________ Cephas Darby: ____________ Franco Nones: ____________ Movimientos: ____________ Comienzo hora: ____________ Cephas Darby: ____________ Franco Nones: ____________ Movimientos: ____________ Comienzo hora: ____________ Cephas Darby: ____________ Franco Nones: ____________ Movimientos: ____________ Comienzo hora: ____________ Cephas Darby: ____________ Franco Nones: ____________ Movimientos: ____________ Comienzo hora:  ____________ Cephas Darby: ____________ Franco Nones: ____________ Movimientos: ____________ Comienzo hora: ____________ Cephas Darby: ____________ Franco Nones: ____________ Movimientos: ____________ Comienzo hora: ____________ Cephas Darby: ____________  Franco Nones: ____________ Movimientos: ____________ Comienzo hora: ____________ Cephas Darby: ____________ Franco Nones: ____________ Movimientos: ____________ Comienzo hora: ____________ Cephas Darby: ____________ Franco Nones: ____________ Movimientos: ____________ Comienzo hora: ____________ Cephas Darby: ____________ Franco Nones: ____________ Movimientos: ____________ Comienzo hora: ____________ Cephas Darby: ____________ Franco Nones: ____________ Movimientos: ____________ Comienzo hora: ____________ Cephas Darby: ____________ Franco Nones: ____________ Movimientos: ____________ Comienzo hora: ____________ Cephas Darby: ____________ Franco Nones: ____________ Movimientos: ____________ Comienzo hora: ____________ Cephas Darby: ____________  Franco Nones: ____________ Movimientos: ____________ Comienzo hora: ____________ Cephas Darby: ____________ Franco Nones: ____________ Movimientos: ____________ Comienzo hora: ____________ Cephas Darby: ____________ Franco Nones: ____________ Movimientos: ____________ Comienzo hora: ____________ Cephas Darby: ____________ Franco Nones: ____________ Movimientos: ____________ Comienzo hora: ____________ Cephas Darby: ____________ Franco Nones: ____________ Movimientos: ____________ Comienzo hora: ____________ Cephas Darby: ____________ Franco Nones: ____________ Movimientos: ____________ Comienzo hora: ____________ Cephas Darby: ____________ Franco Nones: ____________ Movimientos: ____________ Comienzo hora: ____________ Cephas Darby: ____________  Franco Nones: ____________ Movimientos: ____________ Comienzo hora: ____________ Cephas Darby: ____________ Franco Nones: ____________ Movimientos: ____________ Comienzo hora: ____________ Cephas Darby: ____________ Franco Nones: ____________ Movimientos: ____________ Comienzo hora: ____________ Cephas Darby: ____________ Franco Nones: ____________ Movimientos: ____________  Comienzo hora: ____________ Cephas Darby: ____________ Franco Nones: ____________ Movimientos: ____________ Comienzo hora: ____________ Cephas Darby: ____________ Franco Nones: ____________ Movimientos: ____________ Comienzo hora: ____________ Cephas Darby: ____________ Franco Nones: ____________ Movimientos: ____________ Comienzo hora: ____________ Cephas Darby: ____________  Franco Nones: ____________ Movimientos: ____________ Comienzo hora: ____________ Cephas Darby: ____________ Franco Nones: ____________ Movimientos: ____________ Comienzo hora: ____________ Cephas Darby: ____________ Franco Nones: ____________ Movimientos: ____________ Comienzo hora: ____________ Cephas Darby: ____________ Franco Nones: ____________ Movimientos: ____________ Comienzo hora: ____________ Cephas Darby: ____________ Franco Nones: ____________ Movimientos: ____________ Comienzo hora: ____________ Cephas Darby: ____________ Franco Nones: ____________ Movimientos: ____________ Comienzo hora: ____________ Cephas Darby: ____________ Franco Nones: ____________ Movimientos: ____________ Comienzo hora: ____________ Fin hora: ____________  Document Released: 12/01/2007 Document Revised: 08/13/2011 [employer] Patient Information 2012 [place], [place].

## 2012-05-19 ENCOUNTER — Ambulatory Visit (INDEPENDENT_AMBULATORY_CARE_PROVIDER_SITE_OTHER): Payer: Self-pay | Admitting: Family Medicine

## 2012-05-19 VITALS — BP 116/70 | Temp 97.6°F | Wt 153.8 lb

## 2012-05-19 DIAGNOSIS — Z3490 Encounter for supervision of normal pregnancy, unspecified, unspecified trimester: Secondary | ICD-10-CM

## 2012-05-19 DIAGNOSIS — Z3480 Encounter for supervision of other normal pregnancy, unspecified trimester: Secondary | ICD-10-CM

## 2012-05-19 LAB — CBC WITH DIFFERENTIAL/PLATELET
Basophils Absolute: 0 10*3/uL (ref 0.0–0.1)
HCT: 33.6 % — ABNORMAL LOW (ref 36.0–46.0)
Hemoglobin: 11.3 g/dL — ABNORMAL LOW (ref 12.0–15.0)
Lymphocytes Relative: 14 % (ref 12–46)
Monocytes Absolute: 1.4 10*3/uL — ABNORMAL HIGH (ref 0.1–1.0)
Neutro Abs: 7.5 10*3/uL (ref 1.7–7.7)
RDW: 13.6 % (ref 11.5–15.5)
WBC: 10.8 10*3/uL — ABNORMAL HIGH (ref 4.0–10.5)

## 2012-05-19 NOTE — Patient Instructions (Addendum)
It was nice to see you today.   Every thing looks good.   Please go to [facility] if you have vaginal bleeding, if your water breaks, if the baby is not moving well, or if you have 4 contractions an hour for 2 hours, or any concerns. We will give you the flu shot today. Please come back for your next scheduled appointment with Dr. Konrad Dolores. Fue agradable verte hoy. Cada cosa se ??ve bien. Por favor, vaya al hospital de las mujeres si tiene sangrado vaginal, si se rompe el agua, si el beb no se [employer], o si tiene 4 contracciones por hora durante 2 horas, o cualquier inquietud. Le daremos la vacuna contra la gripe en la actualidad. Por favor, vuelve a su prxima cita con el Dr. Konrad Dolores.

## 2012-05-20 LAB — RPR

## 2012-05-21 NOTE — Progress Notes (Signed)
26 yo [id number] 35 and 2/7 here for routine follow up.She reports fleeting abdominal pain when she changes position.  Also reports throat pain/itching for 3 days.  Denies cough, fever, rhinorrhea.  Worse at night.  No problems eating.   See flow sheet for details.   A/P: Pregnancy - doing fine.  PTL precautions and kick counts reviewed. F/U 2 weeks. Needs GBS/gc/cz at that visit. Contraception: Condoms.  Pt worried about other methods because of breast feeding.  We discussed other methods are safe with breastfeeding, but she is fine with condoms. Check 28 week labs today (HIV/RPR/CBC).   Failed 1 hour glucola, but passed 3 hour at 30 weeks. Flu shot today.  Needs Tdap, but unavailable today.

## 2012-06-03 ENCOUNTER — Ambulatory Visit: Payer: Self-pay | Admitting: Family Medicine

## 2012-06-03 ENCOUNTER — Encounter: Payer: Self-pay | Admitting: Family Medicine

## 2012-06-03 ENCOUNTER — Other Ambulatory Visit (HOSPITAL_COMMUNITY)
Admission: RE | Admit: 2012-06-03 | Discharge: 2012-06-03 | Disposition: A | Discharge status: Home / Self Care | Payer: Self-pay | Source: Ambulatory Visit | Attending: Family Medicine | Admitting: Family Medicine

## 2012-06-03 VITALS — BP 110/70 | Wt 157.0 lb

## 2012-06-03 DIAGNOSIS — Z331 Pregnant state, incidental: Secondary | ICD-10-CM

## 2012-06-03 DIAGNOSIS — Z113 Encounter for screening for infections with a predominantly sexual mode of transmission: Secondary | ICD-10-CM | POA: Insufficient documentation

## 2012-06-03 NOTE — Patient Instructions (Addendum)
Thank you for coming in today You and the baby are doing great Please come back in 1 week Go to the hospital if you start having regular contractions, if the baby stops moving, if you have any vaginal bleeding or severe abdominal pain or loss of vaginal fluid.  Gracias por venir hoyUsted y el beb estn haciendo muy bienPor favor, vuelve en 1 semanaVaya al hospital si comienza a Technical brewer regulares, si el beb deja de moverse, si usted tiene cualquier sangrado vaginal o dolor abdominal severo o prdida de lquido vaginal.   Psychiatrist - Systems analyst trimestre (Pregnancy - Third Trimester) El tercer trimestre del embarazo (los ltimos 3 meses) es el perodo de cambios ms rpidos que atraviesan usted y el beb. El aumento de peso es ms rpido. El beb alcanza un largo de aproximadamente 50 cm (20 pulgadas) y pesa entre 2,700 y 4,500 kg (6 a 10 libras). El beb gana ms tejido graso y ya est listo para la vida fuera del cuerpo de la [place]. Mientras estn en el interior, los bebs tienen perodos de sueo y vigilia, Warehouse manager y tienen hipo. Quizs sienta pequeas contracciones del tero. Este es el falso trabajo de [place]. Tambin se las conoce como contracciones de [place]. Es como una prctica del parto. Los problemas ms habituales de esta etapa del embarazo incluyen mayor dificultad para respirar, hinchazn de las manos y los pies por retencin de lquidos y la necesidad de Geographical information systems officer con ms frecuencia debido a que el tero y el beb presionan sobre la vejiga.  EXAMENES PRENATALES  Durante los [employer], deber seguir realizando pruebas de [place], segn avance el [place]. Estas pruebas se realizan para controlar su salud y la del beb. Tambin se realizan anlisis de sangre para [employer] niveles de [place]. La anemia (bajo nivel de hemoglobina) es frecuente durante el embarazo. Para prevenirla, se administran hierro y vitaminas. Tambin le harn nuevas pruebas para  descartar la diabetes. Podrn repetirle algunas de las [employer] Enterprises hicieron previamente.   En cada visita le medirn el tamao del tero. Es para asegurarse de que el beb se desarrolla correctamente.   Tambin en cada visita la pesarn. Esto se realiza para asegurarse de que aumenta de peso al ritmo indicado y que usted y su beb evolucionan normalmente.   En algunas ocasiones se realiza una ecografa para confirmar el correcto desarrollo y evolucin del beb. Esta prueba se realiza con ondas sonoras inofensivas para el beb, de modo que el profesional pueda calcular con ms precisin la fecha del [place].   Discuta las posibilidades de la anestesia si necesita cesrea.  Algunas veces se realizan pruebas especializadas del lquido amnitico que rodea al beb. Esta prueba se denomina amniocentesis. El lquido amnitico se obtiene introduciendo una aguja en el abdomen (vientre). En ocasiones se lleva a cabo cerca del final del embarazo, si es Optician, dispensing. En este caso se realiza para asegurarse de que los pulmones del beb estn lo suficientemente maduros como para que pueda vivir fuera del tero. CAMBIOS QUE OCURREN EN EL TERCER TRIMESTRE DEL EMBARAZO Su organismo atravesar diferentes cambios durante el embarazo que varan de Neomia Dear persona a Educational psychologist. Converse con el profesional que la asiste acerca los cambios que usted note y que la preocupen.  Durante el ltimo trimestre probablemente sienta un aumento del apetito. Es normal tener "antojos" de Development worker, community. Esto vara de Neomia Dear persona a otra y de un embarazo a Therapist, art.   Podrn aparecer las  primeras estras en las caderas, abdomen y mamas. Estos son cambios normales del cuerpo durante el [place]. No existen medicamentos ni ejercicios que puedan prevenir [employer].   El estreimiento puede tratarse con un laxante o agregando fibra a su dieta. Beber grandes cantidades de lquidos, tomar fibras en forma de verduras, frutas y granos  integrales es de [place].   Tambin es beneficioso practicar actividad fsica. Si ha sido una persona Engineer, mining, podr continuar con la [employer]son de las actividades durante el mismo. Si ha sido [employer], puede ser beneficioso que comience con un programa de ejercicios, Museum/gallery exhibitions officer. Consulte con el profesional que la asiste antes de comenzar un programa de ejercicios.   Evite el consumo de cigarrillos, el alcohol, los medicamentos no prescritos y las "drogas de la calle" durante el [place]. Estas sustancias qumicas afectan la formacin y el desarrollo del beb. Evite estas sustancias durante todo el embarazo para asegurar el nacimiento de un beb sano.   Dolor de espalda, venas varicosas y hemorroides podran aparecer o empeorar.   Los movimientos del beb pueden ser ms bruscos y aparecer ms a menudo.   Puede que note dificultades para respirar facilmente.   El ombligo podra salrsele hacia afuera.   Puede segregar un lquido amarillento (calostro) de las [place].   Puede segregar mucus con sangre. Esto normalmente ocurre unos [address] a una semana antes de que comience el [place] de [place].  INSTRUCCIONES PARA EL CUIDADO DOMICILIARIO  La mayor parte de los cuidados que se aconsejan son los mismos que los indicados para las primeras etapas del Psychiatrist. Es importante que concurra a todas las citas con el profesional y siga sus instrucciones con Camera operator a los medicamentos que deba Chemical engineer, a la actividad fsica y a Psychologist, forensic.   Durante el embarazo debe obtener nutrientes para usted y para su beb. Consuma alimentos balanceados a intervalos regulares. Elija alimentos como carne, pescado, [place] y otros productos lcteos descremados, verduras, frutas, panes integrales y cereales. El Equities trader cul es el aumento de peso ideal.   Las relaciones sexuales pueden continuarse hasta casi el final del embarazo, si no se presentan otros problemas como  prdida prematura (antes de tiempo) de lquido amnitico, hemorragia vaginal o dolor abdominal (en el vientre).   Realice [employer], si no tiene restricciones. Consulte con el profesional que la asiste si no sabe con certeza si determinados ejercicios son seguros. El mayor aumento de peso se produce [employer]ocker ltimos trimestres del [place].   Haga reposo con frecuencia, con las piernas elevadas, o segn lo necesite para evitar los calambres y el dolor de cintura.   Use un buen sostn o como los que se usan para hacer deportes para Paramedic la sensibilidad de las [place]. Tambin puede serle til si lo [place] mientras duerme. Si pierde Product manager, podr [employer].   No utilice la baera con agua caliente, baos turcos y saunas.   Colquese el cinturn de seguridad cuando conduzca. Este la proteger a usted y al beb en caso de accidente.   Evite comer carne cruda y el contacto con los utensilios y desperdicios de los gatos. Estos elementos contienen grmenes que pueden causar defectos de nacimiento en el beb.   Es fcil perder algo de orina durante el [place]. Apretar y Chief Operating Officer los msculos de la pelvis la ayudar con este problema. Practique detener la miccin cuando est en el bao. Estos son los  mismos msculos que Development worker, international aid. Son [employer] mismos msculos que utiliza cuando trata de [employer]der System gases. Puede practicar apretando estos msculos Well[employer], y repetir esto tres veces por da aproximadamente. Una vez que conozca qu msculos debe contraer, no realice estos ejercicios durante la miccin. Puede favorecerle una infeccin si la orina vuelve hacia atrs.   Pida ayuda si tiene necesidades econmicas, de asesoramiento o nutricionales durante el [place]. El profesional podr ayudarla con respecto a estas necesidades, o derivarla a otros especialistas.   Practique la ida [employer] hospital a modo de [place].   Tome clases prenatales junto  con su pareja para comprender, practicar y hacer preguntas acerca del Aleen Campi de parto y el nacimiento.   Prepare la habitacin del beb.   No viaje fuera de la ciudad a menos que sea absolutamente necesario y con el consejo del mdico.   Use slo zapatos bajos sin taco para tener un mejor equilibrio y prevenir cadas.  EL CONSUMO DE MEDICAMENTOS Y DROGAS DURANTE EL EMBARAZO  Contine tomando las vitaminas apropiadas para esta etapa tal como se le indic. Las vitaminas deben contener un miligramo de cido flico y deben suplementarse con hierro. Guarde todas las vitaminas fuera del alcance de los nios. La ingestin de slo un par de vitaminas o comprimidos que contengan hierro pueden ocasionar la [employer] Mining en un beb o en un nio pequeo.   Evite el uso de [place], inclusive los de venta [place], que no hayan sido prescritos o indicados por el profesional que la asiste. Algunos medicamentos pueden causar problemas fsicos al beb. Utilice los medicamentos de venta libre o de prescripcin para Chief Technology Officer, Environmental health practitioner o la [place], segn se lo indique el profesional que lo asiste. No utilice aspirina, ibuprofeno (Motrin, Advil, Nuprin) o naproxeno (Aleve) a menos que el profesional la autorice.   El alcohol se asocia a cierto nmero de defectos del nacimiento, incluido el sndrome de alcoholismo fetal. Debe evitar el consumo de alcohol en cualquiera de sus formas. El cigarrillo causa nacimientos prematuros y bebs de bajo peso al nacer. Las drogas de la calle son muy nocivas para el beb y estn absolutamente prohibidas. Un beb que nace de [employer], ser adicto al nacer. Ese beb tendr los mismos sntomas de abstinencia que un adulto.   Infrmele al profesional si consume alguna droga.  SOLICITE ATENCIN MDICA SI: Tiene alguna preocupacin Academic librarian. Es mejor que llame para formular las preguntas si no puede esperar hasta la prxima visita, que sentirse preocupada por ellas.    DECISIONES ACERCA DE LA CIRCUNCISIN Usted puede saber o no cul es el sexo de su beb. Si es un varn, ste es el momento de pensar acerca de la circuncisin. La circuncisin es la extirpacin del prepucio. Esta es la piel que cubre el extremo sensible del pene. No hay un motivo mdico que lo justifique. Generalmente la decisin se toma segn lo que sea popular en ese momento, o se basa en creencias religiosas. Podr conversar estos temas con el profesional que la asiste. SOLICITE ATENCIN MDICA DE INMEDIATO SI:  La temperatura oral se eleva sin motivo por encima de 102 F (38.9 C) o segn le indique el profesional que la asiste.   Tiene una prdida de lquido por la vagina (canal de parto). Si sospecha una ruptura de las [place], tmese la temperatura y llame al profesional para informarlo sobre esto.   Observa unas pequeas manchas, una hemorragia vaginal o elimina cogulos. Avsele al  profesional acerca de la cantidad y de cuntos apsitos est utilizando.   Presenta un olor desagradable en la secrecin vaginal y observa un cambio en el color, de transparente a blanco.   Ha vomitado durante ms de 24 horas.   Presenta escalofros o fiebre.   Comienza a sentir falta de aire.   Siente ardor al Beatrix Shipper.   [place] o sube ms de 900 g (ms de 2 libras), o segn lo indicado por el profesional que la asiste. Observa que sbitamente se le hinchan el rostro, las manos, los pies o las piernas.   Presenta dolor abdominal. Las molestias en el ligamento redondo son Neomia Dear causa benigna (no cancerosa) frecuente de Engineer, mining abdominal durante el Psychiatrist, pero el profesional que la asiste deber evaluarlo.   Presenta dolor de cabeza intenso que no se [place].   Si no siente los movimientos del beb durante ms de tres horas. Si piensa que el beb no se mueve tanto como lo haca habitualmente, coma algo que Psychologist, clinical y Target Corporation lado izquierdo durante [place]. El beb debe moverse al menos 4   5 veces por hora. Comunquese inmediatamente si el beb se mueve menos que lo indicado.   Se cae, se ve involucrada en un accidente automovilstico o sufre algn tipo de traumatismo.   En su hogar hay violencia mental o fsica.  Document Released: 06/03/2005 Document Revised: 08/13/2011 [facility] Patient Information 2012 [place], [place].  Mtodo para contar los movimientos fetales (Fetal Movement Counts) Nombre de la paciente: __________________________________________________ Franco Nones probable de parto:____________________ En los embarazos de alto riesgo se recomienda contar las pataditas, pero tambin es una buena idea que lo hagan todas las [place]. Comience a contarlas a las 28 semanas de embarazo. Los movimientos fetales aumentan luego de una comida Immunologist o de comer o beber algo dulce (el nivel de azcar en la sangre est ms alto). Tambin es importante beber gran cantidad de lquidos (hidratarse bien) antes de contar. Si se recuesta sobre el lado izquierdo mejorar la Designer, industrial/product, o puede sentarse en una silla cmoda con los brazos sobre el abdomen y sin distracciones que la rodeen. CONTANDO  Trate de contar a la [employer] Corporation lo haga.   Marque el da y la hora y vea cunto le lleva sentir 10 movimientos (patadas, agitaciones, sacudones, vueltas). Debe sentir al menos 10 movimientos en 2 horas. Probablemente sienta los 10 movimientos en menos de dos horas. Si no los siente, espere una hora y cuente nuevamente. Luego de [employer] tendr un patrn.   Debemos observar si hay cambios en el patrn o no hay suficientes pataditas en 2 horas. Le lleva ms tiempo contar los 10 movimientos?  SOLICITE ATENCIN MDICA SI:  Siente menos de 10 pataditas en 2 horas. Intntelo dos veces.   No siente movimientos durante 1 hora.   El patrn se modifica o le lleva ms tiempo Art gallery manager las 10 pataditas.   Siente que el beb no se mueve como lo hace habitualmente.  Fecha:  ____________ Movimientos: ____________ Comienzo hora: ____________ Cephas Darby: ____________ Franco Nones: ____________ Movimientos: ____________ Comienzo hora: ____________ Cephas Darby: ____________ Franco Nones: ____________ Movimientos: ____________ Comienzo hora: ____________ Cephas Darby: ____________ Franco Nones: ____________ Movimientos: ____________ Comienzo hora: ____________ Cephas Darby: ____________ Franco Nones: ____________ Movimientos: ____________ Comienzo hora: ____________ Cephas Darby: ____________ Franco Nones: ____________ Movimientos: ____________ Comienzo hora: ____________ Cephas Darby: ____________ Franco Nones: ____________ Movimientos: ____________ Comienzo hora: ____________ Cephas Darby: ____________  Franco Nones: ____________ Movimientos: ____________ Comienzo hora: ____________ Cephas Darby:  ____________ Franco Nones: ____________ Movimientos: ____________ Comienzo hora: ____________ Cephas Darby: ____________ Franco Nones: ____________ Movimientos: ____________ Comienzo hora: ____________ Cephas Darby: ____________ Franco Nones: ____________ Movimientos: ____________ Comienzo hora: ____________ Cephas Darby: ____________ Franco Nones: ____________ Movimientos: ____________ Comienzo hora: ____________ Cephas Darby: ____________ Franco Nones: ____________ Movimientos: ____________ Comienzo hora: ____________ Cephas Darby: ____________ Franco Nones: ____________ Movimientos: ____________ Comienzo hora: ____________ Cephas Darby: ____________  Franco Nones: ____________ Movimientos: ____________ Comienzo hora: ____________ Cephas Darby: ____________ Franco Nones: ____________ Movimientos: ____________ Comienzo hora: ____________ Cephas Darby: ____________ Franco Nones: ____________ Movimientos: ____________ Comienzo hora: ____________ Cephas Darby: ____________ Franco Nones: ____________ Movimientos: ____________ Comienzo hora: ____________ Cephas Darby: ____________ Franco Nones: ____________ Movimientos: ____________ Comienzo hora: ____________ Cephas Darby: ____________ Franco Nones: ____________ Movimientos: ____________ Comienzo hora: ____________ Cephas Darby:  ____________ Franco Nones: ____________ Movimientos: ____________ Comienzo hora: ____________ Cephas Darby: ____________  Franco Nones: ____________ Movimientos: ____________ Comienzo hora: ____________ Cephas Darby: ____________ Franco Nones: ____________ Movimientos: ____________ Comienzo hora: ____________ Cephas Darby: ____________ Franco Nones: ____________ Movimientos: ____________ Comienzo hora: ____________ Cephas Darby: ____________ Franco Nones: ____________ Movimientos: ____________ Comienzo hora: ____________ Cephas Darby: ____________ Franco Nones: ____________ Movimientos: ____________ Comienzo hora: ____________ Cephas Darby: ____________ Franco Nones: ____________ Movimientos: ____________ Comienzo hora: ____________ Cephas Darby: ____________ Franco Nones: ____________ Movimientos: ____________ Comienzo hora: ____________ Cephas Darby: ____________  Franco Nones: ____________ Movimientos: ____________ Comienzo hora: ____________ Cephas Darby: ____________ Franco Nones: ____________ Movimientos: ____________ Comienzo hora: ____________ Cephas Darby: ____________ Franco Nones: ____________ Movimientos: ____________ Comienzo hora: ____________ Cephas Darby: ____________ Franco Nones: ____________ Movimientos: ____________ Comienzo hora: ____________ Cephas Darby: ____________ Franco Nones: ____________ Movimientos: ____________ Comienzo hora: ____________ Cephas Darby: ____________ Franco Nones: ____________ Movimientos: ____________ Comienzo hora: ____________ Cephas Darby: ____________ Franco Nones: ____________ Movimientos: ____________ Comienzo hora: ____________ Cephas Darby: ____________  Franco Nones: ____________ Movimientos: ____________ Comienzo hora: ____________ Cephas Darby: ____________ Franco Nones: ____________ Movimientos: ____________ Comienzo hora: ____________ Cephas Darby: ____________ Franco Nones: ____________ Movimientos: ____________ Comienzo hora: ____________ Cephas Darby: ____________ Franco Nones: ____________ Movimientos: ____________ Comienzo hora: ____________ Cephas Darby: ____________ Franco Nones: ____________ Movimientos: ____________ Comienzo hora:  ____________ Cephas Darby: ____________ Franco Nones: ____________ Movimientos: ____________ Comienzo hora: ____________ Cephas Darby: ____________ Franco Nones: ____________ Movimientos: ____________ Comienzo hora: ____________ Cephas Darby: ____________  Franco Nones: ____________ Movimientos: ____________ Comienzo hora: ____________ Cephas Darby: ____________ Franco Nones: ____________ Movimientos: ____________ Comienzo hora: ____________ Cephas Darby: ____________ Franco Nones: ____________ Movimientos: ____________ Comienzo hora: ____________ Cephas Darby: ____________ Franco Nones: ____________ Movimientos: ____________ Comienzo hora: ____________ Cephas Darby: ____________ Franco Nones: ____________ Movimientos: ____________ Comienzo hora: ____________ Cephas Darby: ____________ Franco Nones: ____________ Movimientos: ____________ Comienzo hora: ____________ Cephas Darby: ____________ Franco Nones: ____________ Movimientos: ____________ Comienzo hora: ____________ Cephas Darby: ____________  Franco Nones: ____________ Movimientos: ____________ Comienzo hora: ____________ Cephas Darby: ____________ Franco Nones: ____________ Movimientos: ____________ Comienzo hora: ____________ Cephas Darby: ____________ Franco Nones: ____________ Movimientos: ____________ Comienzo hora: ____________ Cephas Darby: ____________ Franco Nones: ____________ Movimientos: ____________ Comienzo hora: ____________ Cephas Darby: ____________ Franco Nones: ____________ Movimientos: ____________ Comienzo hora: ____________ Cephas Darby: ____________ Franco Nones: ____________ Movimientos: ____________ Comienzo hora: ____________ Cephas Darby: ____________ Franco Nones: ____________ Movimientos: ____________ Comienzo hora: ____________ Fin hora: ____________  Document Released: 12/01/2007 Document Revised: 08/13/2011 [employer] Patient Information 2012 [place] [place].

## 2012-06-03 NOTE — Progress Notes (Signed)
Interpreter Brenyn Petrey Namihira for Dr Merrell 

## 2012-06-10 ENCOUNTER — Ambulatory Visit (INDEPENDENT_AMBULATORY_CARE_PROVIDER_SITE_OTHER): Payer: Self-pay | Admitting: Family Medicine

## 2012-06-10 VITALS — BP 130/80 | Wt 154.6 lb

## 2012-06-10 DIAGNOSIS — Z3480 Encounter for supervision of other normal pregnancy, unspecified trimester: Secondary | ICD-10-CM

## 2012-06-10 DIAGNOSIS — Z3490 Encounter for supervision of normal pregnancy, unspecified, unspecified trimester: Secondary | ICD-10-CM

## 2012-06-10 NOTE — Patient Instructions (Addendum)
Gracias por venir hoy. Por favor regrese dentro de una semana a ver al Dr. Konrad Dolores. Si tiene contracciones, fugas de lquido o sangrado vaginal, o disminucin de los movimientos fetales ir al [employer] de la [place].

## 2012-06-12 DIAGNOSIS — Z3490 Encounter for supervision of normal pregnancy, unspecified, unspecified trimester: Secondary | ICD-10-CM | POA: Insufficient documentation

## 2012-06-12 NOTE — Progress Notes (Signed)
Phone interpreter used for patient encounter  S: 26 yo G2P1001at 38 and 3/7 here for routine follow up.  She denies any problems or concerns today.  She reports good fetal movement.  She denies abdominal pain, vaginal discharge, or vaginal bleeding.    O: See flow sheet for details.    A/P: Pregnancy - doing fine. Labor precautions and kick counts reviewed. F/U 1 weeks.  Reviewed what to expect if goes past due date.  Reviewed GBS and GC/Chlamydia results with her, All negative Contraception: Condoms.  Pt worried about other methods because of breast feeding.

## 2012-06-14 ENCOUNTER — Encounter: Payer: Self-pay | Admitting: Family Medicine

## 2012-06-17 ENCOUNTER — Ambulatory Visit (INDEPENDENT_AMBULATORY_CARE_PROVIDER_SITE_OTHER): Payer: Self-pay | Admitting: Family Medicine

## 2012-06-17 VITALS — BP 102/72 | Wt 144.0 lb

## 2012-06-17 DIAGNOSIS — Z3490 Encounter for supervision of normal pregnancy, unspecified, unspecified trimester: Secondary | ICD-10-CM

## 2012-06-17 DIAGNOSIS — Z3480 Encounter for supervision of other normal pregnancy, unspecified trimester: Secondary | ICD-10-CM

## 2012-06-17 NOTE — Patient Instructions (Addendum)
It was good to see you, you are 39 weeks and 3 days along.  [facility] if you think you are in labor, your water breaks, or you do not feel like the baby is moving well.    You are scheduled to see Dr. Konrad Dolores on 06/23/12 at 8:30 am.

## 2012-06-17 NOTE — Progress Notes (Signed)
26 year old G2P1001 @39 .3 presents for routine follow up.  She has no complaints.  She reports good fetal movement, no contractions, leakage of fluid, discharge or bleeding.  - Due date 10/15, do not feel cervix has started to change yet.  - GBS Negative  - plans to breast feed, condoms for contraception - labor precautions - f/u with Dr. Konrad Dolores in one week if she has not had the baby by then.

## 2012-06-21 ENCOUNTER — Inpatient Hospital Stay (HOSPITAL_COMMUNITY)
Admission: AD | Admit: 2012-06-21 | Discharge: 2012-06-23 | DRG: 766 | Disposition: A | Discharge status: Home / Self Care | Payer: Medicaid Other | Source: Ambulatory Visit | Attending: Obstetrics & Gynecology | Admitting: Obstetrics & Gynecology

## 2012-06-21 ENCOUNTER — Encounter (HOSPITAL_COMMUNITY): Payer: Self-pay | Admitting: Anesthesiology

## 2012-06-21 ENCOUNTER — Inpatient Hospital Stay (HOSPITAL_COMMUNITY): Payer: Medicaid Other | Admitting: Anesthesiology

## 2012-06-21 ENCOUNTER — Encounter (HOSPITAL_COMMUNITY)
Admission: AD | Disposition: A | Discharge status: Home / Self Care | Payer: Self-pay | Source: Ambulatory Visit | Attending: Obstetrics & Gynecology

## 2012-06-21 ENCOUNTER — Encounter (HOSPITAL_COMMUNITY): Payer: Self-pay | Admitting: *Deleted

## 2012-06-21 ENCOUNTER — Encounter (HOSPITAL_COMMUNITY): Payer: Self-pay

## 2012-06-21 DIAGNOSIS — O321XX0 Maternal care for breech presentation, not applicable or unspecified: Principal | ICD-10-CM | POA: Diagnosis present

## 2012-06-21 DIAGNOSIS — O4290 Premature rupture of membranes, unspecified as to length of time between rupture and onset of labor, unspecified weeks of gestation: Secondary | ICD-10-CM

## 2012-06-21 DIAGNOSIS — Z3490 Encounter for supervision of normal pregnancy, unspecified, unspecified trimester: Secondary | ICD-10-CM

## 2012-06-21 HISTORY — DX: Other specified health status: Z78.9

## 2012-06-21 LAB — OB RESULTS CONSOLE GC/CHLAMYDIA
Chlamydia: NEGATIVE
Gonorrhea: NEGATIVE

## 2012-06-21 LAB — AMNISURE RUPTURE OF MEMBRANE (ROM) NOT AT ARMC: Amnisure ROM: POSITIVE

## 2012-06-21 LAB — CBC
MCHC: 32.8 g/dL (ref 30.0–36.0)
Platelets: 402 10*3/uL — ABNORMAL HIGH (ref 150–400)
RDW: 14.9 % (ref 11.5–15.5)
WBC: 11.9 10*3/uL — ABNORMAL HIGH (ref 4.0–10.5)

## 2012-06-21 LAB — TYPE AND SCREEN
ABO/RH(D): O POS
Antibody Screen: NEGATIVE

## 2012-06-21 LAB — ABO/RH: ABO/RH(D): O POS

## 2012-06-21 LAB — RPR: RPR Ser Ql: NONREACTIVE

## 2012-06-21 SURGERY — Surgical Case
Anesthesia: Spinal | Wound class: Clean Contaminated

## 2012-06-21 MED ORDER — PHENYLEPHRINE 40 MCG/ML (10ML) SYRINGE FOR IV PUSH (FOR BLOOD PRESSURE SUPPORT)
PREFILLED_SYRINGE | INTRAVENOUS | Status: AC
  Filled 2012-06-21: qty 5

## 2012-06-21 MED ORDER — ONDANSETRON HCL 4 MG/2ML IJ SOLN: 4.0000 mg | INTRAMUSCULAR | Status: DC | PRN

## 2012-06-21 MED ORDER — SIMETHICONE 80 MG PO CHEW
80.0000 mg | CHEWABLE_TABLET | ORAL | Status: DC | PRN
  Administered 2012-06-21: 80 mg via ORAL

## 2012-06-21 MED ORDER — FENTANYL CITRATE 0.05 MG/ML IJ SOLN
INTRAMUSCULAR | Status: DC | PRN
  Administered 2012-06-21: 25 ug via INTRATHECAL

## 2012-06-21 MED ORDER — LANOLIN HYDROUS EX OINT: 1.0000 "application " | TOPICAL_OINTMENT | CUTANEOUS | Status: DC | PRN

## 2012-06-21 MED ORDER — MORPHINE SULFATE 0.5 MG/ML IJ SOLN
INTRAMUSCULAR | Status: AC
  Filled 2012-06-21: qty 10

## 2012-06-21 MED ORDER — BUPIVACAINE HCL (PF) 0.5 % IJ SOLN
INTRAMUSCULAR | Status: DC | PRN
  Administered 2012-06-21: 30 mL

## 2012-06-21 MED ORDER — NALBUPHINE HCL 10 MG/ML IJ SOLN
5.0000 mg | INTRAMUSCULAR | Status: DC | PRN
  Filled 2012-06-21: qty 1

## 2012-06-21 MED ORDER — LACTATED RINGERS IV SOLN: 500.0000 mL | INTRAVENOUS | Status: DC | PRN

## 2012-06-21 MED ORDER — DIPHENHYDRAMINE HCL 25 MG PO CAPS
25.0000 mg | ORAL_CAPSULE | ORAL | Status: DC | PRN
  Filled 2012-06-21: qty 1

## 2012-06-21 MED ORDER — IBUPROFEN 600 MG PO TABS: 600.0000 mg | ORAL_TABLET | Freq: Four times a day (QID) | ORAL | Status: DC | PRN

## 2012-06-21 MED ORDER — MEPERIDINE HCL 25 MG/ML IJ SOLN: 6.2500 mg | INTRAMUSCULAR | Status: DC | PRN

## 2012-06-21 MED ORDER — 0.9 % SODIUM CHLORIDE (POUR BTL) OPTIME
TOPICAL | Status: DC | PRN
  Administered 2012-06-21: 1000 mL

## 2012-06-21 MED ORDER — NALOXONE HCL 0.4 MG/ML IJ SOLN: 0.4000 mg | INTRAMUSCULAR | Status: DC | PRN

## 2012-06-21 MED ORDER — DIPHENHYDRAMINE HCL 25 MG PO CAPS: 25.0000 mg | ORAL_CAPSULE | Freq: Four times a day (QID) | ORAL | Status: DC | PRN

## 2012-06-21 MED ORDER — CEFAZOLIN SODIUM-DEXTROSE 2-3 GM-% IV SOLR
INTRAVENOUS | Status: AC
  Filled 2012-06-21: qty 50

## 2012-06-21 MED ORDER — LIDOCAINE HCL (PF) 1 % IJ SOLN: 30.0000 mL | INTRAMUSCULAR | Status: DC | PRN

## 2012-06-21 MED ORDER — KETOROLAC TROMETHAMINE 30 MG/ML IJ SOLN
INTRAMUSCULAR | Status: AC
  Filled 2012-06-21: qty 1

## 2012-06-21 MED ORDER — SENNOSIDES-DOCUSATE SODIUM 8.6-50 MG PO TABS
2.0000 | ORAL_TABLET | Freq: Every day | ORAL | Status: DC
  Administered 2012-06-21 – 2012-06-22 (×2): 2 via ORAL

## 2012-06-21 MED ORDER — FENTANYL CITRATE 0.05 MG/ML IJ SOLN: 25.0000 ug | INTRAMUSCULAR | Status: DC | PRN

## 2012-06-21 MED ORDER — LACTATED RINGERS IV SOLN
INTRAVENOUS | Status: DC
  Administered 2012-06-21: 12:00:00 via INTRAVENOUS

## 2012-06-21 MED ORDER — OXYCODONE-ACETAMINOPHEN 5-325 MG PO TABS: 1.0000 | ORAL_TABLET | ORAL | Status: DC | PRN

## 2012-06-21 MED ORDER — KETOROLAC TROMETHAMINE 30 MG/ML IJ SOLN
30.0000 mg | Freq: Four times a day (QID) | INTRAMUSCULAR | Status: AC | PRN
  Administered 2012-06-21: 30 mg via INTRAVENOUS

## 2012-06-21 MED ORDER — CEFAZOLIN SODIUM-DEXTROSE 2-3 GM-% IV SOLR
2.0000 g | Freq: Once | INTRAVENOUS | Status: AC
  Administered 2012-06-21: 2 g via INTRAVENOUS
  Filled 2012-06-21: qty 50

## 2012-06-21 MED ORDER — OXYTOCIN 10 UNIT/ML IJ SOLN
40.0000 [IU] | INTRAVENOUS | Status: DC | PRN
  Administered 2012-06-21: 40 [IU] via INTRAVENOUS

## 2012-06-21 MED ORDER — ONDANSETRON HCL 4 MG/2ML IJ SOLN
INTRAMUSCULAR | Status: AC
  Filled 2012-06-21: qty 2

## 2012-06-21 MED ORDER — SODIUM CHLORIDE 0.9 % IJ SOLN: 3.0000 mL | Freq: Two times a day (BID) | INTRAMUSCULAR | Status: DC

## 2012-06-21 MED ORDER — DIPHENHYDRAMINE HCL 50 MG/ML IJ SOLN: 12.5000 mg | INTRAMUSCULAR | Status: DC | PRN

## 2012-06-21 MED ORDER — SCOPOLAMINE 1 MG/3DAYS TD PT72
1.0000 | MEDICATED_PATCH | Freq: Once | TRANSDERMAL | Status: DC
  Administered 2012-06-21: 1.5 mg via TRANSDERMAL

## 2012-06-21 MED ORDER — ONDANSETRON HCL 4 MG/2ML IJ SOLN: 4.0000 mg | Freq: Four times a day (QID) | INTRAMUSCULAR | Status: DC | PRN

## 2012-06-21 MED ORDER — OXYTOCIN 40 UNITS IN LACTATED RINGERS INFUSION - SIMPLE MED
1.0000 m[IU]/min | INTRAVENOUS | Status: DC
  Administered 2012-06-21: 2 m[IU]/min via INTRAVENOUS
  Filled 2012-06-21: qty 1000

## 2012-06-21 MED ORDER — LACTATED RINGERS IV SOLN
INTRAVENOUS | Status: DC
  Administered 2012-06-21: via INTRAVENOUS

## 2012-06-21 MED ORDER — CITRIC ACID-SODIUM CITRATE 334-500 MG/5ML PO SOLN
30.0000 mL | ORAL | Status: DC | PRN
  Administered 2012-06-21: 30 mL via ORAL
  Filled 2012-06-21: qty 15

## 2012-06-21 MED ORDER — TETANUS-DIPHTH-ACELL PERTUSSIS 5-2.5-18.5 LF-MCG/0.5 IM SUSP
0.5000 mL | Freq: Once | INTRAMUSCULAR | Status: AC
  Administered 2012-06-23: 0.5 mL via INTRAMUSCULAR
  Filled 2012-06-21: qty 0.5

## 2012-06-21 MED ORDER — ACETAMINOPHEN 325 MG PO TABS: 650.0000 mg | ORAL_TABLET | ORAL | Status: DC | PRN

## 2012-06-21 MED ORDER — ONDANSETRON HCL 4 MG PO TABS: 4.0000 mg | ORAL_TABLET | ORAL | Status: DC | PRN

## 2012-06-21 MED ORDER — IBUPROFEN 600 MG PO TABS
600.0000 mg | ORAL_TABLET | Freq: Four times a day (QID) | ORAL | Status: DC | PRN
  Filled 2012-06-21: qty 1

## 2012-06-21 MED ORDER — SODIUM CHLORIDE 0.9 % IJ SOLN: 3.0000 mL | INTRAMUSCULAR | Status: DC | PRN

## 2012-06-21 MED ORDER — FENTANYL CITRATE 0.05 MG/ML IJ SOLN
INTRAMUSCULAR | Status: DC | PRN
  Administered 2012-06-21: 75 ug via INTRAVENOUS

## 2012-06-21 MED ORDER — ONDANSETRON HCL 4 MG/2ML IJ SOLN
INTRAMUSCULAR | Status: DC | PRN
  Administered 2012-06-21: 4 mg via INTRAVENOUS

## 2012-06-21 MED ORDER — LACTATED RINGERS IV SOLN
INTRAVENOUS | Status: DC | PRN
  Administered 2012-06-21 (×3): via INTRAVENOUS

## 2012-06-21 MED ORDER — METOCLOPRAMIDE HCL 5 MG/ML IJ SOLN: 10.0000 mg | Freq: Three times a day (TID) | INTRAMUSCULAR | Status: DC | PRN

## 2012-06-21 MED ORDER — PRENATAL MULTIVITAMIN CH
1.0000 | ORAL_TABLET | Freq: Every day | ORAL | Status: DC
  Administered 2012-06-22 – 2012-06-23 (×2): 1 via ORAL
  Filled 2012-06-21 (×2): qty 1

## 2012-06-21 MED ORDER — ONDANSETRON HCL 4 MG/2ML IJ SOLN: 4.0000 mg | Freq: Three times a day (TID) | INTRAMUSCULAR | Status: DC | PRN

## 2012-06-21 MED ORDER — OXYTOCIN 10 UNIT/ML IJ SOLN
INTRAMUSCULAR | Status: AC
  Filled 2012-06-21: qty 1

## 2012-06-21 MED ORDER — SODIUM CHLORIDE 0.9 % IV SOLN
1.0000 ug/kg/h | INTRAVENOUS | Status: DC | PRN
  Filled 2012-06-21: qty 2.5

## 2012-06-21 MED ORDER — OXYTOCIN 40 UNITS IN LACTATED RINGERS INFUSION - SIMPLE MED: 62.5000 mL/h | Freq: Once | INTRAVENOUS | Status: DC

## 2012-06-21 MED ORDER — OXYTOCIN BOLUS FROM INFUSION
500.0000 mL | Freq: Once | INTRAVENOUS | Status: DC
  Filled 2012-06-21: qty 500

## 2012-06-21 MED ORDER — WITCH HAZEL-GLYCERIN EX PADS: 1.0000 "application " | MEDICATED_PAD | CUTANEOUS | Status: DC | PRN

## 2012-06-21 MED ORDER — LACTATED RINGERS IV SOLN
INTRAVENOUS | Status: DC | PRN
  Administered 2012-06-21 (×4): via INTRAVENOUS

## 2012-06-21 MED ORDER — KETOROLAC TROMETHAMINE 30 MG/ML IJ SOLN: 30.0000 mg | Freq: Four times a day (QID) | INTRAMUSCULAR | Status: AC | PRN

## 2012-06-21 MED ORDER — MORPHINE SULFATE (PF) 0.5 MG/ML IJ SOLN
INTRAMUSCULAR | Status: DC | PRN
  Administered 2012-06-21: .15 mg via EPIDURAL

## 2012-06-21 MED ORDER — LACTATED RINGERS IV SOLN: INTRAVENOUS | Status: DC | PRN

## 2012-06-21 MED ORDER — NALBUPHINE SYRINGE 5 MG/0.5 ML: 10.0000 mg | INJECTION | INTRAMUSCULAR | Status: DC | PRN

## 2012-06-21 MED ORDER — OXYTOCIN 40 UNITS IN LACTATED RINGERS INFUSION - SIMPLE MED: 62.5000 mL/h | INTRAVENOUS | Status: AC

## 2012-06-21 MED ORDER — OXYCODONE-ACETAMINOPHEN 5-325 MG PO TABS
1.0000 | ORAL_TABLET | ORAL | Status: DC | PRN
  Administered 2012-06-22: 2 via ORAL
  Administered 2012-06-22 – 2012-06-23 (×2): 1 via ORAL
  Filled 2012-06-21 (×2): qty 1
  Filled 2012-06-21: qty 2

## 2012-06-21 MED ORDER — DIPHENHYDRAMINE HCL 50 MG/ML IJ SOLN: 25.0000 mg | INTRAMUSCULAR | Status: DC | PRN

## 2012-06-21 MED ORDER — DIBUCAINE 1 % RE OINT: 1.0000 "application " | TOPICAL_OINTMENT | RECTAL | Status: DC | PRN

## 2012-06-21 MED ORDER — BUPIVACAINE IN DEXTROSE 0.75-8.25 % IT SOLN
INTRATHECAL | Status: DC | PRN
  Administered 2012-06-21: 1.4 mL via INTRATHECAL

## 2012-06-21 MED ORDER — IBUPROFEN 600 MG PO TABS
600.0000 mg | ORAL_TABLET | Freq: Four times a day (QID) | ORAL | Status: DC
  Administered 2012-06-21 – 2012-06-23 (×6): 600 mg via ORAL
  Filled 2012-06-21 (×5): qty 1

## 2012-06-21 MED ORDER — ZOLPIDEM TARTRATE 5 MG PO TABS: 5.0000 mg | ORAL_TABLET | Freq: Every evening | ORAL | Status: DC | PRN

## 2012-06-21 MED ORDER — MENTHOL 3 MG MT LOZG: 1.0000 | LOZENGE | OROMUCOSAL | Status: DC | PRN

## 2012-06-21 MED ORDER — FENTANYL CITRATE 0.05 MG/ML IJ SOLN
INTRAMUSCULAR | Status: AC
  Filled 2012-06-21: qty 2

## 2012-06-21 MED ORDER — TERBUTALINE SULFATE 1 MG/ML IJ SOLN: 0.2500 mg | Freq: Once | INTRAMUSCULAR | Status: DC | PRN

## 2012-06-21 MED ORDER — SCOPOLAMINE 1 MG/3DAYS TD PT72
MEDICATED_PATCH | TRANSDERMAL | Status: AC
  Filled 2012-06-21: qty 1

## 2012-06-21 MED ORDER — SODIUM CHLORIDE 0.9 % IV SOLN: 250.0000 mL | INTRAVENOUS | Status: DC

## 2012-06-21 MED ORDER — PHENYLEPHRINE HCL 10 MG/ML IJ SOLN
INTRAMUSCULAR | Status: DC | PRN
  Administered 2012-06-21 (×5): 40 ug via INTRAVENOUS

## 2012-06-21 MED ORDER — MAGNESIUM HYDROXIDE 400 MG/5ML PO SUSP: 30.0000 mL | ORAL | Status: DC | PRN

## 2012-06-21 SURGICAL SUPPLY — 36 items
BENZOIN TINCTURE PRP APPL 2/3 (GAUZE/BANDAGES/DRESSINGS) ×2 IMPLANT
CLOTH BEACON ORANGE TIMEOUT ST (SAFETY) ×2 IMPLANT
DRAPE SURG 17X23 STRL (DRAPES) ×2 IMPLANT
DRESSING TELFA 8X3 (GAUZE/BANDAGES/DRESSINGS) ×2 IMPLANT
DRSG COVADERM 4X10 (GAUZE/BANDAGES/DRESSINGS) IMPLANT
DURAPREP 26ML APPLICATOR (WOUND CARE) ×2 IMPLANT
ELECT REM PT RETURN 9FT ADLT (ELECTROSURGICAL) ×2
ELECTRODE REM PT RTRN 9FT ADLT (ELECTROSURGICAL) ×1 IMPLANT
EXTRACTOR VACUUM M CUP 4 TUBE (SUCTIONS) IMPLANT
GAUZE SPONGE 4X4 12PLY STRL LF (GAUZE/BANDAGES/DRESSINGS) ×4 IMPLANT
GLOVE BIO SURGEON STRL SZ7 (GLOVE) ×2 IMPLANT
GLOVE BIOGEL PI IND STRL 7.0 (GLOVE) ×2 IMPLANT
GLOVE BIOGEL PI INDICATOR 7.0 (GLOVE) ×2
GOWN PREVENTION PLUS LG XLONG (DISPOSABLE) ×6 IMPLANT
KIT ABG SYR 3ML LUER SLIP (SYRINGE) IMPLANT
NEEDLE HYPO 22GX1.5 SAFETY (NEEDLE) ×2 IMPLANT
NEEDLE HYPO 25X5/8 SAFETYGLIDE (NEEDLE) ×2 IMPLANT
NS IRRIG 1000ML POUR BTL (IV SOLUTION) ×2 IMPLANT
PACK C SECTION WH (CUSTOM PROCEDURE TRAY) ×2 IMPLANT
PAD ABD 7.5X8 STRL (GAUZE/BANDAGES/DRESSINGS) IMPLANT
PAD OB MATERNITY 4.3X12.25 (PERSONAL CARE ITEMS) IMPLANT
RTRCTR C-SECT PINK 25CM LRG (MISCELLANEOUS) IMPLANT
SLEEVE SCD COMPRESS KNEE LRG (MISCELLANEOUS) IMPLANT
SLEEVE SCD COMPRESS KNEE MED (MISCELLANEOUS) IMPLANT
STAPLER VISISTAT 35W (STAPLE) IMPLANT
STRIP CLOSURE SKIN 1/2X4 (GAUZE/BANDAGES/DRESSINGS) ×2 IMPLANT
SUT MNCRL 0 VIOLET CTX 36 (SUTURE) ×2 IMPLANT
SUT MONOCRYL 0 CTX 36 (SUTURE) ×2
SUT PDS AB 0 CT1 27 (SUTURE) IMPLANT
SUT PDS AB 0 CTX 36 PDP370T (SUTURE) IMPLANT
SUT VIC AB 0 CT1 36 (SUTURE) ×4 IMPLANT
SUT VIC AB 4-0 KS 27 (SUTURE) ×2 IMPLANT
SYR CONTROL 10ML LL (SYRINGE) ×2 IMPLANT
TOWEL OR 17X24 6PK STRL BLUE (TOWEL DISPOSABLE) ×4 IMPLANT
TRAY FOLEY CATH 14FR (SET/KITS/TRAYS/PACK) ×2 IMPLANT
WATER STERILE IRR 1000ML POUR (IV SOLUTION) ×2 IMPLANT

## 2012-06-21 NOTE — Progress Notes (Signed)
[facility] Department of Clinical Social Work Documentation of Interpretation   I assisted _Cheryl  RN__________________ with interpretation of __questions____________________ for this patient.

## 2012-06-21 NOTE — H&P (Signed)
Tina Pruitt is a 26 y.o. female G2P1001 at [redacted]w[redacted]d  presenting for Rupture of membranes at 12:00 AM last night.  Maternal Medical History:  Reason for admission: Reason for admission: rupture of membranes.  Reason for Admission:   nauseaContractions: Onset was 6-12 hours ago.   Frequency: irregular.   Perceived severity is mild.    Fetal activity: Perceived fetal activity is normal.   Last perceived fetal movement was within the past hour.    Prenatal complications: no prenatal complications Prenatal Complications - Diabetes: none.    OB History    Grav Para Term Preterm Abortions TAB SAB Ect Mult Living   2 1 1  0 0 0 0 0 0 1     Past Medical History  Diagnosis Date  . No pertinent past medical history    No past surgical history on file. Family History: family history is not on file. Social History:  reports that she has never smoked. She does not have any smokeless tobacco history on file. She reports that she does not drink alcohol or use illicit drugs.   Prenatal Transfer Tool  Maternal Diabetes: No Genetic Screening: Declined  Too late for care Maternal Ultrasounds/Referrals: Normal Fetal Ultrasounds or other Referrals:  None Maternal Substance Abuse:  No Significant Maternal Medications:  None Significant Maternal Lab Results:  Lab values include: Group B Strep negative Other Comments:  None  Review of Systems  Constitutional: Negative for fever and chills.  Eyes: Negative for blurred vision and double vision.  Gastrointestinal: Negative for nausea and vomiting.  Genitourinary: Negative for dysuria.  Neurological: Negative for dizziness and headaches.    Dilation: 3 Effacement (%): Thick Station: -2 Exam by:: Rwanda Smith CNM Blood pressure 116/71, pulse 101, temperature 97.7 F (36.5 C), resp. rate 18, height 5' 0.5" (1.537 m), weight 71.578 kg (157 lb 12.8 oz), last menstrual period 09/15/2011. Maternal Exam:  Uterine Assessment: Contraction strength is  mild.  Contraction frequency is irregular.   Abdomen: Fundal height is appropriate for gestational age.   Fetal presentation: vertex  Pelvis: adequate for delivery.   Cervix: Cervix evaluated by digital exam.     Fetal Exam Fetal Monitor Review: Mode: ultrasound.   Baseline rate: 135-140.  Variability: moderate (6-25 bpm).   Pattern: accelerations present and no decelerations.    Fetal State Assessment: Category I - tracings are normal.     Physical Exam  Constitutional: She is oriented to person, place, and time. She appears well-developed and well-nourished. No distress.  HENT:  Head: Normocephalic and atraumatic.  Eyes: Conjunctivae normal and EOM are normal.  Neck: Normal range of motion. Neck supple.  Cardiovascular: Normal rate and regular rhythm.   Respiratory: Effort normal. No respiratory distress.  GI: Soft. There is no tenderness.  Musculoskeletal: Normal range of motion. She exhibits no edema.  Neurological: She is alert and oriented to person, place, and time.  Skin: Skin is warm and dry.  Psychiatric: She has a normal mood and affect.    Prenatal labs: ABO, Rh: O/POS/-- (04/24 1346) Antibody: NEG (04/24 1346) Rubella: 148.1 (04/24 1346) RPR: NON REAC (09/12 1653)  HBsAg: NEGATIVE (04/24 1346)  HIV: NON REACTIVE (09/12 1653)  GBS: NEGATIVE (09/27 1651)   Assessment/Plan: 26 y.o. G2P1001 at [redacted]w[redacted]d with PROM 12 hours ago, confirmed by Amnisure Contracting moderately but irregularly. No cervical change during MAU stay.  - Admit to L&D - Pitocin - GBS negative - Anticipate SVD  Napoleon Form, MD     Napoleon Form  06/21/2012, 11:38 AM

## 2012-06-21 NOTE — Transfer of Care (Signed)
Immediate Anesthesia Transfer of Care Note  Patient: Tina Pruitt  Procedure(s) Performed: Procedure(s) (LRB) with comments: CESAREAN SECTION (N/A)  Patient Location: PACU  Anesthesia Type: Spinal  Level of Consciousness: awake, alert , oriented and patient cooperative  Airway & Oxygen Therapy: Patient Spontanous Breathing  Post-op Assessment: Report given to PACU RN and Post -op Vital signs reviewed and stable  Post vital signs: Reviewed and stable  Complications: No apparent anesthesia complications

## 2012-06-21 NOTE — MAU Provider Note (Signed)
Attestation of Attending Supervision of Advanced Practitioner (CNM/NP): Evaluation and management procedures were performed by the Advanced Practitioner under my supervision and collaboration.  I have reviewed the Advanced Practitioner's note and chart, and I agree with the management and plan.  Vidalia Serpas, MD, FACOG Attending Obstetrician & Gynecologist Faculty Practice, [facility]  

## 2012-06-21 NOTE — MAU Note (Signed)
Around 2:00 a.m. Felt like water broke followed by bloody show with contractions every 5 minutes 5/10 on pain scale.

## 2012-06-21 NOTE — Preoperative (Signed)
Beta Blockers   Reason not to administer Beta Blockers:Not Applicable 

## 2012-06-21 NOTE — Progress Notes (Signed)
[facility] Department of Clinical Social Work Documentation of Interpretation   I assisted ___Erin RN________________ with interpretation of __plan of care____________________ for this patient.

## 2012-06-21 NOTE — Anesthesia Preprocedure Evaluation (Signed)
Anesthesia Evaluation  Patient identified by MRN, date of birth, ID band Patient awake    Reviewed: Allergy & Precautions, H&P , Patient's Chart, lab work & pertinent test results  Airway Mallampati: II TM Distance: >3 FB Neck ROM: Full    Dental No notable dental hx. (+) Teeth Intact   Pulmonary neg pulmonary ROS,  breath sounds clear to auscultation  Pulmonary exam normal       Cardiovascular negative cardio ROS  Rhythm:Regular Rate:Normal     Neuro/Psych negative neurological ROS  negative psych ROS   GI/Hepatic negative GI ROS, Neg liver ROS,   Endo/Other  negative endocrine ROS  Renal/GU negative Renal ROS  negative genitourinary   Musculoskeletal   Abdominal Normal abdominal exam  (+)   Peds  Hematology negative hematology ROS (+)   Anesthesia Other Findings   Reproductive/Obstetrics (+) Pregnancy                           Anesthesia Physical Anesthesia Plan  ASA: II and Emergent  Anesthesia Plan: Spinal   Post-op Pain Management:    Induction:   Airway Management Planned:   Additional Equipment:   Intra-op Plan:   Post-operative Plan:   Informed Consent: I have reviewed the patients History and Physical, chart, labs and discussed the procedure including the risks, benefits and alternatives for the proposed anesthesia with the patient or authorized representative who has indicated his/her understanding and acceptance.     Plan Discussed with: Anesthesiologist  Anesthesia Plan Comments:         Anesthesia Quick Evaluation

## 2012-06-21 NOTE — Anesthesia Procedure Notes (Signed)
Spinal  Patient location during procedure: OR Start time: 06/21/2012 2:40 PM Staffing Anesthesiologist: FOSTER, MICHAEL A. Performed by: anesthesiologist  Preanesthetic Checklist Completed: patient identified, site marked, surgical consent, pre-op evaluation, timeout performed, IV checked, risks and benefits discussed and monitors and equipment checked Spinal Block Patient position: sitting Prep: site prepped and draped and DuraPrep Patient monitoring: heart rate, cardiac monitor, continuous pulse ox and blood pressure Approach: midline Location: L3-4 Injection technique: single-shot Needle Needle type: Sprotte  Needle gauge: 24 G Needle length: 9 cm Needle insertion depth: 4 cm Assessment Sensory level: T4 Additional Notes Patient tolerated procedure well. Adequate sensory level.

## 2012-06-21 NOTE — MAU Provider Note (Addendum)
MAU Attending Progress Note  26 y.o. G2P1001 at  [redacted]w[redacted]d here for labor evaluation and possible SROM. Followed at [facility] - Dr. Konrad Dolores  Blood pressure 116/71, pulse 101, temperature 97.7 F (36.5 C), resp. rate 18, height 5' 0.5" (1.537 m), weight 71.578 kg (157 lb 12.8 oz), last menstrual period 09/15/2011. Reactive NST Contractions q4-5 mins Cervix negative pool, fern, nitrazine. Cervix 3/long/high/vertex  Patient will ambulate for about one hour and be reevaluated.  Jaynie Collins, MD, FACOG Attending Obstetrician & Gynecologist Faculty Practice, [facility]  Assumed care of pt at 0930.  Cervix unchanged, but small amount of thin, blood-tinged fluid on perineum and glove after exam. BOW felt of cervical exam. Will send amnisure and D/C if negative.  [place], CNM 06/21/2012 11:02 AM

## 2012-06-21 NOTE — Op Note (Signed)
Tina Pruitt PROCEDURE DATE: 06/21/2012  PREOPERATIVE DIAGNOSIS: Intrauterine pregnancy at  [redacted]w[redacted]d weeks gestation; breech malpresentation; active labor  POSTOPERATIVE DIAGNOSIS: The same  PROCEDURE: Primary Low Transverse Cesarean Section  SURGEON:  Dr. Jaynie Collins  ASSISTANT:  Dr. Napoleon Form  ANESTHESIOLOGIST: Tyrone Apple. Malen Gauze, MD  INDICATIONS: Tina Pruitt is a 26 y.o. G2P1001 at [redacted]w[redacted]d here for cesarean section secondary to the indications listed under preoperative diagnosis; please see preoperative note for further details.  The risks of cesarean section were discussed with the patient including but were not limited to: bleeding which may require transfusion or reoperation; infection which may require antibiotics; injury to bowel, bladder, ureters or other surrounding organs; injury to the fetus; need for additional procedures including hysterectomy in the event of a life-threatening hemorrhage; placental abnormalities wth subsequent pregnancies, incisional problems, thromboembolic phenomenon and other postoperative/anesthesia complications.   The patient concurred with the proposed plan, giving informed written consent for the procedure.  Spanish interpreter present for counseling.   FINDINGS:  Viable female infant in breech presentation.  Apgars 9 and 9.  Clear amniotic fluid.  Intact placenta, three vessel cord.  Normal uterus, fallopian tubes and ovaries bilaterally.  ANESTHESIA: Spinal INTRAVENOUS FLUIDS: 1700 ml ESTIMATED BLOOD LOSS: 700 ml URINE OUTPUT:  300 ml SPECIMENS: Placenta sent to L&D COMPLICATIONS: None immediate  PROCEDURE IN DETAIL:  The patient preoperatively received intravenous antibiotics and had sequential compression devices applied to her lower extremities.  She was then taken to the operating room where spinal anesthesia was administered and was found to be adequate. She was then placed in a dorsal supine position with a leftward tilt, and prepped and draped  in a sterile manner.  A foley catheter was placed into her bladder and attached to constant gravity.  After an adequate timeout was performed, a Pfannenstiel skin incision was made with scalpel and carried through to the underlying layer of fascia. The fascia was incised in the midline, and this incision was extended bilaterally using the Mayo scissors.  Kocher clamps were applied to the superior aspect of the fascial incision and the underlying rectus muscles were dissected off bluntly. A similar process was carried out on the inferior aspect of the fascial incision. The rectus muscles were separated in the midline bluntly and the peritoneum was entered bluntly. Attention was turned to the lower uterine segment where a low transverse hysterotomy was made with a scalpel and extended bilaterally bluntly.  The infant was successfully delivered in frank breech presentation, the cord was clamped and cut and the infant was handed over to awaiting neonatology team. Uterine massage was then administered, and the placenta delivered intact with a three-vessel cord. The uterus was then cleared of clot and debris.  The hysterotomy was closed with 0 Monocryl in a running locked fashion, and an imbricating layer was also placed with a 0 Monocryl suture. The pelvis was cleared of all clot and debris. Hemostasis was confirmed on all surfaces.  The peritoneum and the muscles were reapproximated using 0 Monocryl interrupted stitches. The fascia was then closed using 0 Vicryl/PDS in a running fashion.  The subcutaneous layer was irrigated, and the skin was closed with a 4-0 Vicryl subcuticular stitch. 30 ml of 0.5% Marcaine was injected subcutaneously around the incision. The patient tolerated the procedure well. Sponge, lap, instrument and needle counts were correct x 2.  She was taken to the recovery room in stable condition.

## 2012-06-21 NOTE — Progress Notes (Signed)
Attending Note  Patient evaluated on L&D, found to be have fetus in breech malpresentation on bedside ultrasound. She is grossly ruptured, 4.5 cm dilated, contracting frequently.  Reactive NST. Patient advised of need for cesarean delivery using Spanish interpreter.  The risks of cesarean section discussed with the patient included but were not limited to: bleeding which may require transfusion or reoperation; infection which may require antibiotics; injury to bowel, bladder, ureters or other surrounding organs; injury to the fetus; need for additional procedures including hysterectomy in the event of a life-threatening hemorrhage; placental abnormalities wth subsequent pregnancies, incisional problems, thromboembolic phenomenon and other postoperative/anesthesia complications. The patient concurred with the proposed plan, giving informed written consent for the procedure.  Anesthesia and OR aware. Preoperative prophylactic antibiotics and SCDs ordered on call to the OR.  To OR when ready.  Jaynie Collins, MD, FACOG Attending Obstetrician & Gynecologist Faculty Practice, [facility]

## 2012-06-22 ENCOUNTER — Encounter (HOSPITAL_COMMUNITY): Payer: Self-pay | Admitting: Obstetrics & Gynecology

## 2012-06-22 LAB — CBC
Hemoglobin: 9.6 g/dL — ABNORMAL LOW (ref 12.0–15.0)
MCHC: 32.5 g/dL (ref 30.0–36.0)
Platelets: 339 10*3/uL (ref 150–400)
RDW: 15.1 % (ref 11.5–15.5)

## 2012-06-22 NOTE — Anesthesia Postprocedure Evaluation (Signed)
  Anesthesia Post-op Note  Patient: Tina Pruitt  Procedure(s) Performed: Procedure(s) (LRB) with comments: CESAREAN SECTION (N/A)  Patient Location: PACU  Anesthesia Type: Spinal  Level of Consciousness: awake, alert  and oriented  Airway and Oxygen Therapy: Patient Spontanous Breathing  Post-op Pain: none  Post-op Assessment: Post-op Vital signs reviewed, Patient's Cardiovascular Status Stable, Respiratory Function Stable, Patent Airway, No signs of Nausea or vomiting, Adequate PO intake, Pain level controlled, No headache, No backache, No residual numbness and No residual motor weakness  Post-op Vital Signs: Reviewed and stable  Complications: No apparent anesthesia complications

## 2012-06-22 NOTE — Transfer of Care (Signed)
Anesthesia Post Note  Patient: Tina Pruitt  Procedure(s) Performed: Procedure(s) (LRB): CESAREAN SECTION (N/A)  Anesthesia type: Spinal  Patient location: Mother/Baby  Post pain: Pain level controlled  Post assessment: Post-op Vital signs reviewed  Last Vitals:  Filed Vitals:   06/22/12 0647  BP: 99/57  Pulse: 89  Temp: 36.9 C  Resp: 18    Post vital signs: Reviewed  Level of consciousness: awake  Complications: No apparent anesthesia complications

## 2012-06-22 NOTE — Progress Notes (Signed)
Attestation of Attending Supervision of Resident: Evaluation and management procedures were performed by the [facility] Medicine Resident under my supervision.  I have seen and examined the patient, reviewed the resident's note and chart, and I agree with the management and plan.  Anibal Henderson, M.D. 06/22/2012 1:33 PM

## 2012-06-22 NOTE — Progress Notes (Signed)
Ur chart review completed.  

## 2012-06-22 NOTE — Progress Notes (Signed)
Post Partum Day . Post-op following c-section due to breech presentation.  Subjective: Patient reports doing well with some pain at the incision site. She reports ambulating, eating/drinking, voiding. No flatus or BM. She is breast-feeding.   Objective: Blood pressure 99/57, pulse 89, temperature 98.5 F (36.9 C), temperature source Oral, resp. rate 18, height 5' (1.524 m), weight 65.318 kg (144 lb), last menstrual period 09/15/2011, SpO2 96.00%, unknown if currently breastfeeding.  Physical Exam:  General: alert, cooperative and no distress Cardio: normal rate/rhythm, no murmurs/rubs/gallops, 2+ DP pulses bilateral  Pulm: normal rate, no respiratory distress, no wheezes/rales/rhonchi Lochia: appropriate Uterine Fundus: firm Incision: healing well, no significant drainage, no significant erythema DVT Evaluation: No evidence of DVT seen on physical exam. Negative Homan's sign. No cords or calf tenderness. No significant calf/ankle edema.   Basename 06/22/12 0555 06/21/12 1155  HGB 9.6* 11.5*  HCT 29.5* 35.1*    Assessment/Plan: Plan for discharge tomorrow, Breastfeeding and Contraception still needs to be discussed with patient.   LOS: 1 day   [name] 06/22/2012, 7:38 AM

## 2012-06-22 NOTE — Addendum Note (Signed)
Addendum  created 06/22/12 0928 by Kippy Gohman A Marquez Ceesay, CRNA   Modules edited:Notes Section    

## 2012-06-23 ENCOUNTER — Encounter: Payer: Self-pay | Admitting: Family Medicine

## 2012-06-23 MED ORDER — IBUPROFEN 600 MG PO TABS: 600.0000 mg | ORAL_TABLET | Freq: Four times a day (QID) | ORAL | Status: DC | PRN

## 2012-06-23 MED ORDER — OXYCODONE-ACETAMINOPHEN 5-325 MG PO TABS: 1.0000 | ORAL_TABLET | ORAL | Status: DC | PRN

## 2012-06-23 NOTE — Discharge Summary (Signed)
Obstetric Discharge Summary Reason for Admission: onset of labor and cesarean section breech presentation  Prenatal Procedures: none Intrapartum Procedures: cesarean: low cervical, transverse Postpartum Procedures: none Complications-Operative and Postpartum: none  Subjective:   History obtained with Spanish interpreter present: Patient reports doing well with some pain at the incision site while walking. This is relieved with pain medication. She reports ambulating well, eating/drinking, voiding, +flatus; no BM. She reports no CP, SOB, LE edema.    Hemoglobin  Date Value Range Status  06/22/2012 9.6* 12.0 - 15.0 g/dL Final     HCT  Date Value Range Status  06/22/2012 29.5* 36.0 - 46.0 % Final    Physical Exam:  General: alert, cooperative and no distress Cardio: normal rate, 2+ DP pulses bilateral  Pulm: normal rate, no respiratory distress Lochia: appropriate Uterine Fundus: firm Incision: healing well, no significant drainage, no dehiscence, no significant erythema DVT Evaluation: No evidence of DVT seen on physical exam. Negative Homan's sign. No cords or calf tenderness. No significant calf/ankle edema.  Discharge Diagnoses: Term Pregnancy-delivered  Discharge Information: Date: 06/23/2012 Activity: pelvic rest Diet: routine Medications: Ibuprofen, Colace and Percocet Condition: stable Instructions: refer to practice specific booklet Discharge to: home Patient is breast feeding and plans to use condoms for contraception.   Newborn Data: Live born female  Birth Weight: 7 lb 0.4 oz (3185 g) APGAR: 9, 9  Home with mother.  [name] 06/23/2012, 7:30 AM  I have seen and examined this patient and agree the above assessment. CRESENZO-DISHMAN,Jaryd Drew 06/23/2012 7:53 AM

## 2012-06-24 ENCOUNTER — Encounter: Payer: Self-pay | Admitting: Family Medicine

## 2012-07-07 ENCOUNTER — Ambulatory Visit: Payer: Self-pay | Admitting: Obstetrics & Gynecology

## 2012-07-07 VITALS — BP 114/74 | HR 89 | Temp 97.2°F | Resp 12

## 2012-07-07 DIAGNOSIS — Z9889 Other specified postprocedural states: Secondary | ICD-10-CM

## 2012-07-07 NOTE — Progress Notes (Signed)
  Subjective:    Patient ID: Tina Pruitt, female    DOB: 11-Sep-1985, 26 y.o.   MRN: [id number]  HPI  Pt s/p c/s 2 weeks ago.  No complaints.  Here for routine incision check.  Review of Systems Filed Vitals:   07/07/12 1057  BP: 114/74  Pulse: 89  Temp: 97.2 F (36.2 C)  TempSrc: Oral  Resp: 12       Objective:   Physical Exam  Incision well healed.  Has f/u appt with [facility] in November for routine pp check.      Assessment & Plan:

## 2012-08-03 ENCOUNTER — Encounter: Payer: Self-pay | Admitting: Family Medicine

## 2012-08-03 ENCOUNTER — Ambulatory Visit (INDEPENDENT_AMBULATORY_CARE_PROVIDER_SITE_OTHER): Payer: Medicaid Other | Admitting: Family Medicine

## 2012-08-03 VITALS — BP 114/70 | HR 99 | Temp 98.4°F | Ht 60.0 in | Wt 135.0 lb

## 2012-08-03 DIAGNOSIS — K219 Gastro-esophageal reflux disease without esophagitis: Secondary | ICD-10-CM

## 2012-08-03 NOTE — Patient Instructions (Addendum)
Thank you for coming in today You are doing great Continue breast feeding Your C-section scar looks like it is healing well Your orange card will not cover contraception Apply for Medicaid if you can Come back to see me as needed.  Gracias por venir hoy Usted est haciendo un gran Continuar la lactancia materna Su cesrea cicatriz parece que se est recuperando bien Su tarjeta naranja no cubrir la Nurse, learning disability para Medicaid si puedes Vuelve a verme cuando sea necesario. Parto por Orvan Seen posteriores  (Cesarean Delivery, Care After) Siga estas instrucciones durante las prximas semanas. Estas indicaciones le proporcionan informacin general acerca de cmo deber cuidarse despus del procedimiento. El mdico tambin podr darle instrucciones especficas. El tratamiento ha sido planificado segn las prcticas mdicas actuales, pero en algunos casos pueden ocurrir problemas. Comunquese con el mdico si tiene algn problema o tiene preguntas despus del procedimiento.  INSTRUCCIONES PARA EL CUIDADO EN EL HOGAR  La curacin puede demorar algn tiempo. Puede sentir molestias, sensibilidad, hinchazn y hematomas en el sitio de la operacin, durante algunas semanas. Esto es normal y Scientist, clinical (histocompatibility and immunogenetics) a medida que pase el [place].  Actividad  Al volver a su casa, durante las 2 primeras semanas descanse siempre que pueda.  Siempre que le sea posible, solicite ayuda para Education officer, environmental las actividades domsticas y para el cuidado del beb, durante 2  3 semanas.  Limite las tareas domsticas y la actividad social. Aumente gradualmente su actividad a medida que recupera la fuerza.  No suba escaleras ms de 2 o 3 veces por da.  No levante nada que sea ms pesado que su beb.  Siga las instrucciones de su mdico con respecto a Solicitor.  Consulte con su mdico si puede practicar ejercicios. Nutricin  Puede volver a su dieta habitual. Consuma una dieta normal y bien  balanceada.  Debe ingerir gran cantidad de lquido para mantener la orina de tono claro o color amarillo plido.  Siga tomando los suplementos para el perodo prenatal o el complejo multivitamnico.  No beba alcohol hasta que el mdico la autorice. Evacuacin Debe retornar a su funcin intestinal habitual. Si est constipada, consulte a su mdico para tomar un laxante suave que la ayudar a ir al bao. Los lquidos y los alimentos que contengan salvado la ayudarn para el problema de la constipacin. Gradualmente agregue frutas, verduras y salvado a su dieta. Higiene  Puede darse una ducha, lavarse el cabello y usar la baera, excepto que su mdico le indique otra cosa.  Contine con los cuidados perineales hasta que la secrecin vaginal se detenga.  No se haga duchas vaginales ni use tampones hasta que el mdico la autorice. Fiebre Si se siente [place] o tiene escalofros, Automotive engineer. La fiebre puede indicar que hay una infeccin. Las infecciones se tratan con medicamentos.  Control del Dolor  Tome slo medicamentos de venta libre o prescriptos, segn las indicaciones del mdico. No tome aspirina. Puede ocasionar hemorragias.  No conduzca mientras toma analgsicos.  Consulte a su mdico para volver a tomar o ajustar las dosis de sus medicamentos habituales. Cuidados de la Incisin  Limpie la herida (incisin) suavemente con jabn y agua.  Si el mdico la autoriza, deje la herida sin vendaje, excepto que observe supuracin o irritacin.  Si tiene pequeas tiras [employer] que cruzan la incisin y no se caen dentro de los [address], retrelas suavemente.  Controle diariamente la incisin y observe si aumenta el enrojecimiento, supura, se hincha o se separa la piel.  Abrace una almohada cuando tosa o estornude. Esto ayuda a Engineer, materials. Cuidados Vaginales La secrecin vaginal o la hemorragia pueden durar hasta 6 semanas. Si esta secrecin se torna de color rojo  brillante, presenta un olor ftido, es demasiado abundante, contiene cogulos o si siente ardor al Geographical information systems officer o debe hacerlo con mucha frecuencia, llame al profesional que la asiste. Si la hemorragia disminuye y luego se hace ms intensa, es su organismo que le dice que debe disminuir la actividad y relajarse ms. Relaciones Sexuales  Consulte a su mdico antes de reanudar la actividad sexual. [place], luego de 4 a 6 semanas, si se siente bien y descansada, podr reanudar la actividad sexual. Evite las posiciones que fuercen el sitio de la incisin.  Puede quedar embarazada antes de tener el perodo. Si reanuda las relaciones sexuales, debe utilizar algn mtodo anticonceptivo si no quiere quedar embarazada enseguida. Prcticas Saludables  Cumpla con todas las visitas de control, segn le indique su mdico. Generalmente el mdico indicar que vuelva en 2  3 semanas.  Contine con los exmenes plvicos anuales.  Contine con el autoexamen de [employer] y los exmenes anuales que incluyan un [place]. Cuidado de las Mamas  Si no est Insurance risk surveyor, y sus Building control surveyor se tornan sensibles, se endurecen o la [place], puede usar un sostn que le ajuste firmemente y Contractor hielo.  Si est amamantando, use un buen sostn.  Comunquese con el profesional que la asiste si siente dolor en las [place], sntomas similares a una gripe, Teacher, English as a foreign language o endurecimiento y enrojecimiento de las mamas. Depresin Posparto Despus del entusiasmo por la llegada del beb, generalmente puede suceder un perodo de abatimiento o depresin. Comente sus sensaciones con su pareja, familiares y amigos. Puede estar originado en la modificacin de los niveles de hormonas en el organismo. Si esto la preocupa, puede contactarse con el profesional que la asiste. MISCELNEAS  Limite el uso de bombachas de sostn o medias panty.  Si amamanta, puede ser que no tenga su perodo menstrual por algunos meses o ms. Esto es normal en una  mujer que [place]. Si no Architectural technologist de las 6 [employer] posteriores a la interrupcin de la Market researcher, consulte a su mdico.  Si no est amamantando, debe esperar que comience a Clinical cytogeneticist de las 6 a 12 semanas posteriores al parto. Si no ha comenzado para la 11 semana, consulte a su mdico. SOLICITE ATENCIN MDICA SI:  Presenta enrojecimiento, hinchazn o aumento del dolor en la herida.  Aparece pus en la herida.  Advierte un olor ftido que proviene de la herida o del vendaje.  La zona de la inyeccin intravenosa se hincha, se inflama o duele.  La herida se abre (los bordes no estn unidos).  Se siente mareada o sufre un desmayo.  Siente dolor al Geographical information systems officer u [place] con [place].  Presenta diarrea.  Presenta nuseas o vmitos.  Brett Fairy secrecin vaginal anormal.  Aparece una erupcin cutnea.  Sufre algn tipo de reaccin anormal o alrgica debido a los medicamentos que toma.  El dolor no se [place] con los medicamentos o [place].  Su temperatura es de 101 F (38.3 C), o es 100.4 F (38 C) tomada 2 veces en un perodo de 4 horas. SOLICITE ATENCIN MDICA DE INMEDIATO SI:  La temperatura se eleva por encima de 102 F (38.9 C).  Siente dolor abdominal.  Siente dolor en el pecho.  Le falta el aire.  Se desmaya.  Presenta dolor, enrojecimiento o hinchazn en las piernas.  Sufre una hemorragia intensa con o sin cogulos de Retail buyer. Document Released: 08/24/2005 Document Revised: 11/16/2011 [facility] Patient Information 2013 [place], [place].   Eleccin del mtodo anticonceptivo  (Contraception Choices) La anticoncepcin (control de la natalidad) es el uso de cualquier mtodo o dispositivo para Location manager. A continuacin se indican algunos de esos mtodos.  MTODOS HORMONALES   Implante anticonceptivo. Es un tubo plstico delgado que contiene la hormona progesterona. No contiene estrgenos. El mdico inserta el tubo en la parte interna del brazo. El  tubo puede Geneticist, molecular durante 3 aos. Despus de los 3 aos debe retirarse. El implante impide que los ovarios liberen vulos (ovulacin), espesa el moco cervical, lo que evita que los espermatozoides ingresen al tero y hace ms delgada la membrana que cubre el interior del tero.  Inyecciones de progesterona sola. Estas inyecciones se administran cada 3 meses para evitar el embarazo. La progesterona sinttica impide que los ovarios liberen vulos. Tambin hace que el moco cervical se espese y modifica el recubrimiento interno del tero. Esto hace ms difcil que los espermatozoides sobrevivan en el tero.  Pldoras anticonceptivas. Las pldoras anticonceptivas contienen estrgenos y Education officer, museum. Actan impidiendo que el vulo se forme en el ovario(ovulacin). Las pldoras anticonceptivas son recetadas por el mdico.Tambin se utilizan para tratar los perodos menstruales abundantes.  Minipldora. Este tipo de pldora anticonceptiva contiene slo hormona progesterona. Deben tomarse todos los [address]  [address] del mes y debe recetarlas el mdico.  Parches anticonceptivos. El parche contiene hormonas similares a las que contienen las pldoras anticonceptivas. Deben cambiarse una vez por semana y se utilizan bajo prescripcin mdica.  Anillo vaginal. Anillo vaginal contiene hormonas similares a las que contienen las pldoras anticonceptivas. Se deja colocado durante tres semanas, se lo retira durante 1 semana y luego se coloca uno nuevo. La paciente debe sentirse cmoda para insertar y retirar el anillo de la vagina.Es necesaria la receta del mdico.  Anticonceptivos de Associate Professor. Los anticonceptivos de emergencia son mtodos para evitar un embarazo despus de una relacin sexual sin proteccin. Esta pldora puede tomarse inmediatamente despus de Child psychotherapist sexuales o hasta 5 [place] de haber tenido sexo sin proteccin. Es ms efectiva si se toma poco tiempo despus. Los anticonceptivos de  emergencia estn disponibles sin prescripcin mdica. Consltelo con su farmacutico. No use los anticonceptivos de emergencia como nico mtodo anticonceptivo. MTODOS DE BARRERA   Condn masculino. Es una vaina delgada (ltex o goma) que se [place] en el pene durante el acto sexual. Deri Fuelling con espermicida para aumentar la efectividad.  Condn femenino. Es una vaina blanda y floja que se adapta suavemente a la vagina antes de las relaciones sexuales.  Diafragma. Es una barrera de ltex redonda y Casimer Bilis que debe ser ajustada por un profesional. Se inserta en la vagina, junto con un gel espermicida. Debe insertarse antes de Management consultant. Debe dejar el diafragma colocado en la vagina durante 6 a 8 horas despus de la relacin sexual.  Capuchn cervical. Es una taza de ltex o plstico, redonda y [place] que cubre el cuello del tero y debe ser ajustada por un mdico. Puede dejarlo colocado en la vagina hasta 48 horas despus de las Clinical research associate.  Esponja. Es una pieza blanda y circular de espuma de poliuretano. Contiene un espermicida. Se inserta en la vagina despus de mojarla y antes de las [employer].  Espermicidas. Los espermicidas son qumicos que matan o bloquean el esperma y no lo dejan ingresar al cuello del tero y al  teroCleophus Molt en forma de cremas, geles, supositorios, espuma o comprimidos. No es necesario tener Emergency planning/management officer. Se insertan en la vagina con un aplicador antes de Management consultant. El proceso debe repetirse cada vez que tiene relaciones sexuales. ANTICONCEPTIVOS INTRAUTERINOS   Dispositivo intrauterino (DIU). Es un dispositivo en forma de T que se coloca en el tero durante el perodo menstrual, para Location manager. Hay dos tipos:  DIU de cobre. Este tipo de DIU est recubierto con un alambre de cobre y se inserta dentro del tero. El cobre hace que el tero y las trompas de Falopio produzcan un liquido que [employer]  espermatozoides. Puede permanecer colocado durante 10 aos.  DIU hormonal. Este tipo de DIU contiene la hormona progestina (progesterona sinttica). La hormona espesa el moco cervical y evita que los espermatozoides ingresen al tero y tambin afina la membrana que cubre el tero para evitar la implantacin del vulo fertilizado. La hormona debilita o destruye los espermatozoides que ingresan al tero. Puede permanecer colocado durante 5 aos. MTODOS ANTICONCEPTIVOS PERMANENTES   Ligadura de trompas en la mujer. La ligadura de trompas en la mujer se realiza sellando, atando u obstruyendo quirrgicamente las trompas de Falopio lo que impide que el vulo descienda hacia el tero.  Esterilizacin masculina. Se realiza atando los conductos por los que pasan los espermatozoides (vasectoma).Esto impide que el esperma ingrese a la vagina durante el acto sexual. Luego del procedimiento, el hombre puede eyacular lquido (semen). MTODOS DE PLANIFICACIN NATURAL   Planificacin familiar natural.  Consiste en no tener relaciones sexuales o usar un mtodo de barrera (condn, [place], capuchn cervical) en los [employer] la mujer podra quedar [place].  Mtodo calendario.  Consiste en el seguimiento de la duracin de cada ciclo menstrual y la identificacin de los perodos frtiles.  Mtodo de Occupational hygienist.  Consiste en evitar las relaciones sexuales durante la ovulacin.  Mtodo sintotrmico. Paramedic las relaciones sexuales en la poca en la que se est ovulando, utilizando un termmetro y tendiendo en cuenta los sntomas de la ovulacin.  Mtodo post-ovulacin. Consiste en planificar las relaciones sexuales para despus de haber ovulado. Independientemente del tipo o mtodo anticonceptivo que usted elija, es importante que use condones para protegerse contra las enfermedades de transmisin sexual (ETS). Hable con su mdico con respecto a qu mtodo anticonceptivo es el ms apropiado para  usted.  Document Released: 08/24/2005 Document Revised: 11/16/2011 [facility] Patient Information 2013 [place], [place].

## 2012-08-07 NOTE — Assessment & Plan Note (Signed)
Doing well Continue to breast feed C-section site is healing well OK to resume intercourse Pt to use only condoms for contraception at this time Unable to afford IUD or nexplanon.

## 2012-08-07 NOTE — Progress Notes (Signed)
Tina Pruitt is a 26 y.o. female who presents to [facility] today for postpartum care  Pt is 6 wks postpartum. Pt feels well since c-section. Denies vaginal bleedign or discharge, abdominal pain, dysuria, fevers, breast tenderness or pain. Unsure if c-section is healing properly. Bonding well w/ child. Continues to breast feed. Denies depression. Good family support. Husband shares responsibilities w/ children. Uses condoms for contraception.   The following portions of the patient's history were reviewed and updated as appropriate: allergies, current medications, past medical history, family and social history, and problem list.  Patient is a nonsmoker  Past Medical History  Diagnosis Date  . No pertinent past medical history     ROS as above otherwise neg.    Medications reviewed. Current Outpatient Prescriptions  Medication Sig Dispense Refill  . ibuprofen (ADVIL,MOTRIN) 600 MG tablet Take 1 tablet (600 mg total) by mouth every 6 (six) hours as needed.  30 tablet  1  . oxyCODONE-acetaminophen (PERCOCET/ROXICET) 5-325 MG per tablet Take 1-2 tablets by mouth every 4 (four) hours as needed (moderate - severe pain).  30 tablet  0  . Prenatal Vit-Fe Fumarate-FA (PRENATAL MULTIVITAMIN) TABS Take 1 tablet by mouth every morning.        Exam:  BP 114/70  Pulse 99  Temp 98.4 F (36.9 C) (Oral)  Ht 5' (1.524 m)  Wt 135 lb (61.236 kg)  BMI 26.37 kg/m2 Gen: Well NAD HEENT: EOMI,  MMM Lungs: CTABL Nl WOB Heart: RRR no MRG Abd: NABS, NT, ND, uterus was palpated and is firm and well below the umbilicus Skin: c-section site evident w/ well healed linear scar. No evidence of dehiscence, induration, discharge, or erythema. Nonpainful to palpation Exts: Non edematous BL  LE, warm and well perfused.   No results found for this or any previous visit (from the past 72 hour(s)).

## 2013-09-07 NOTE — L&D Delivery Note (Signed)
Delivery Note At 12:09 AM a viable female was delivered via  (Presentation: ;  ).  APGAR: , ; weight .   Placenta status: , .  Cord:  with the following complications: .  Cord pH: not done  Anesthesia:   Episiotomy:  Lacerations:  Suture Repair: 2.0 Est. Blood Loss (mL):   Mom to postpartum.  Baby to Couplet care / Skin to Skin.  MARSHALL,BERNARD A 04/04/2014, 12:20 AM

## 2013-09-12 LAB — OB RESULTS CONSOLE ABO/RH: RH TYPE: POSITIVE

## 2013-09-12 LAB — OB RESULTS CONSOLE RPR: RPR: NONREACTIVE

## 2013-09-12 LAB — OB RESULTS CONSOLE RUBELLA ANTIBODY, IGM: Rubella: IMMUNE

## 2013-09-12 LAB — OB RESULTS CONSOLE GC/CHLAMYDIA
Chlamydia: NEGATIVE
Gonorrhea: NEGATIVE

## 2013-09-12 LAB — OB RESULTS CONSOLE HIV ANTIBODY (ROUTINE TESTING): HIV: NONREACTIVE

## 2013-09-12 LAB — OB RESULTS CONSOLE HEPATITIS B SURFACE ANTIGEN: HEP B S AG: NEGATIVE

## 2013-09-12 LAB — OB RESULTS CONSOLE ANTIBODY SCREEN: Antibody Screen: NEGATIVE

## 2013-09-12 LAB — CYTOLOGY - PAP: Pap: NEGATIVE

## 2014-02-22 IMAGING — US US OB COMP +14 WK
1 series · 12 of 28 positions shown · non-contrast
Comparison: none

[Series 1: us ob comp +14 wk · 82 acquisitions, 12 frames shown]
[im 4/82]
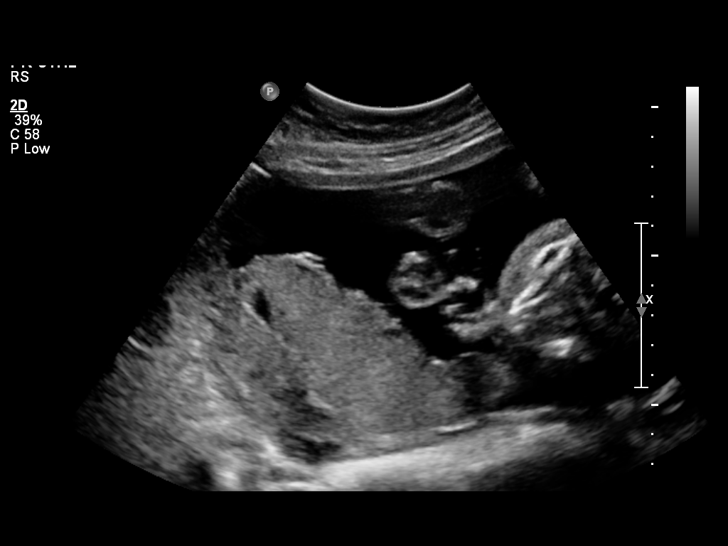
[im 10/82]
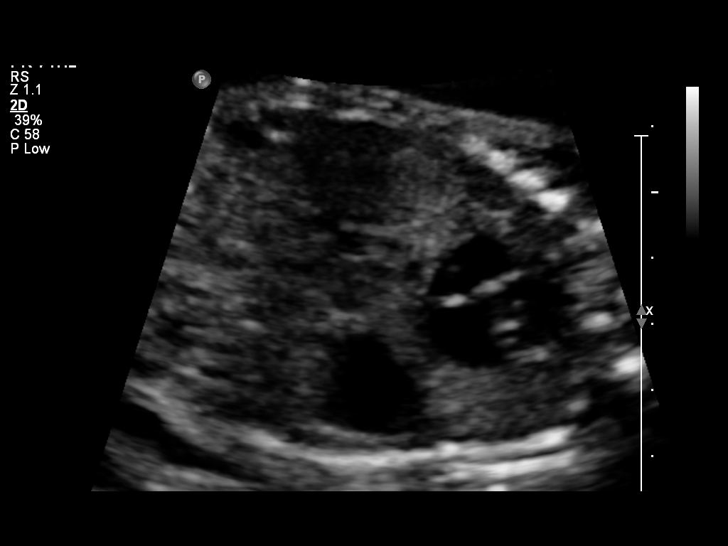
[im 16/82]
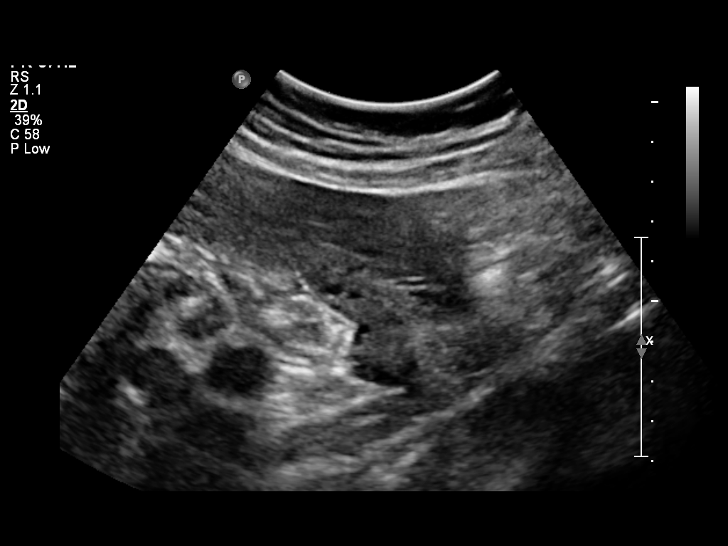
[im 25/82]
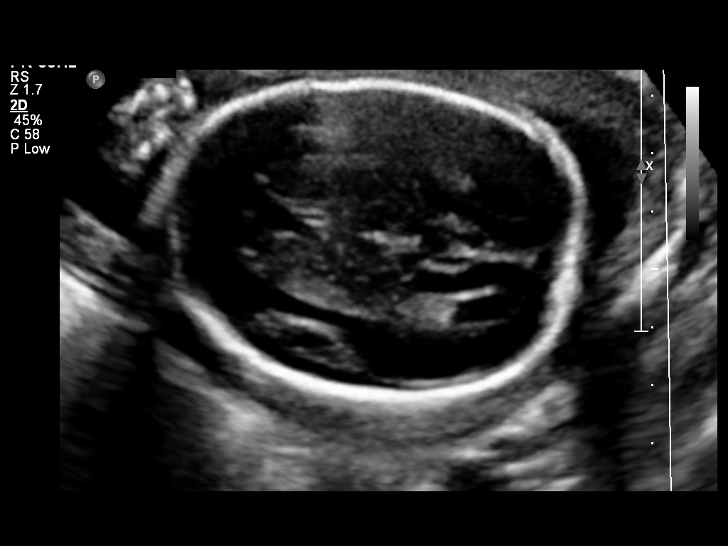
[im 31/82]
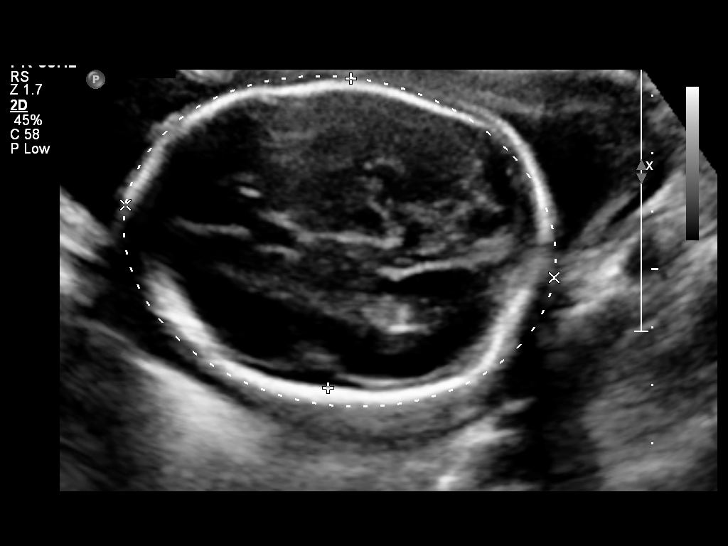
[im 37/82]
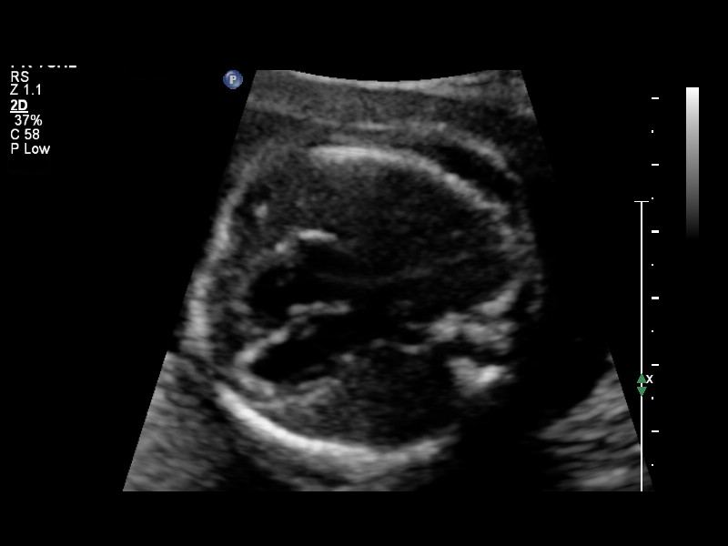
[im 46/82]
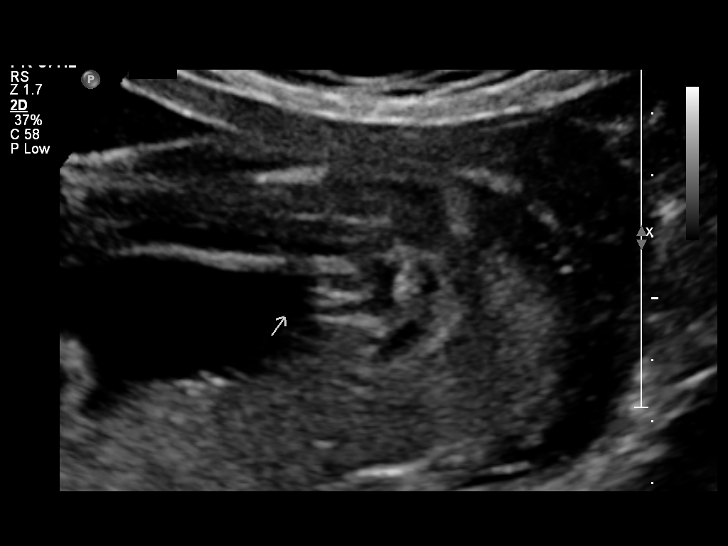
[im 52/82]
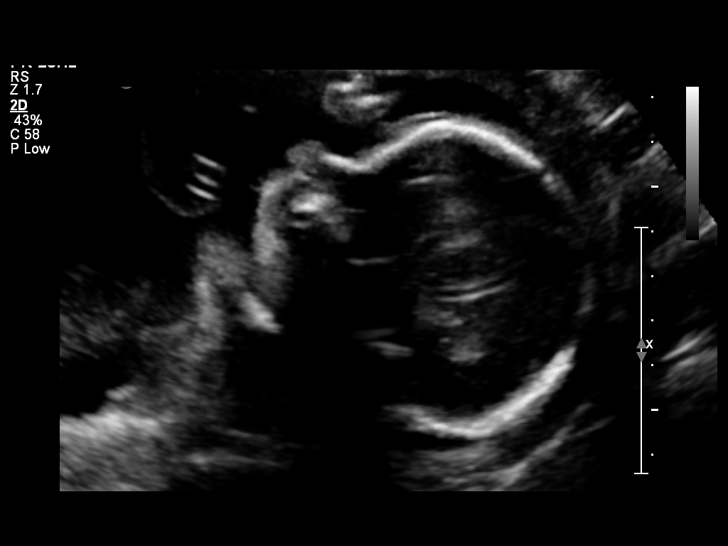
[im 58/82]
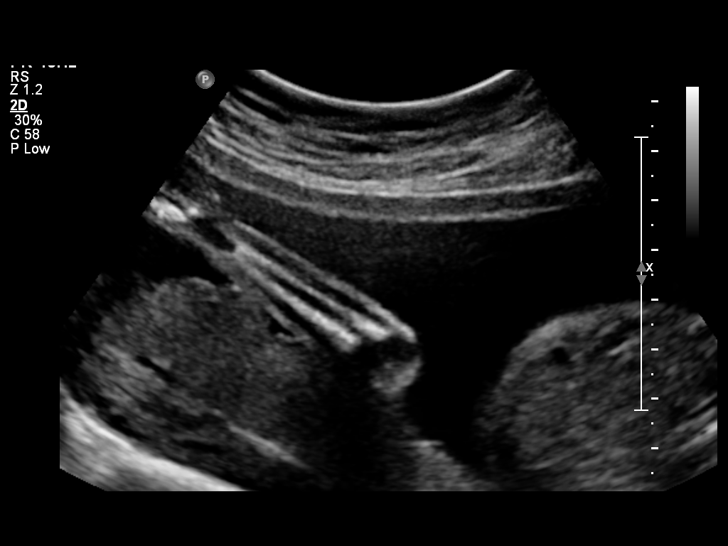
[im 67/82]
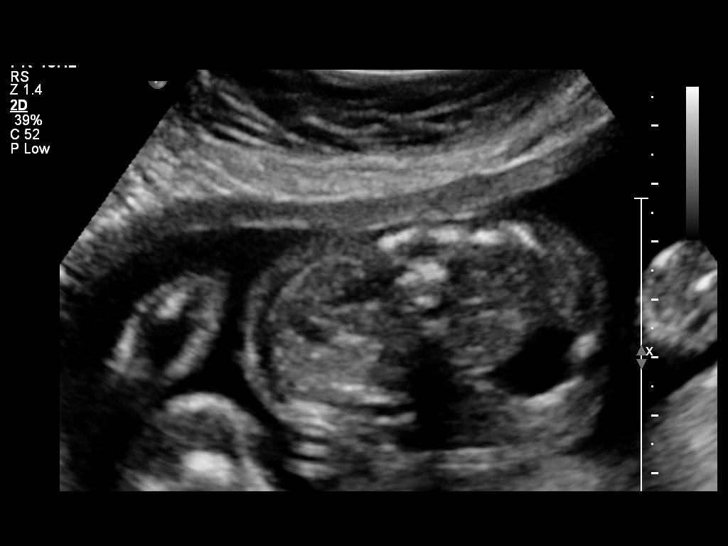
[im 73/82]
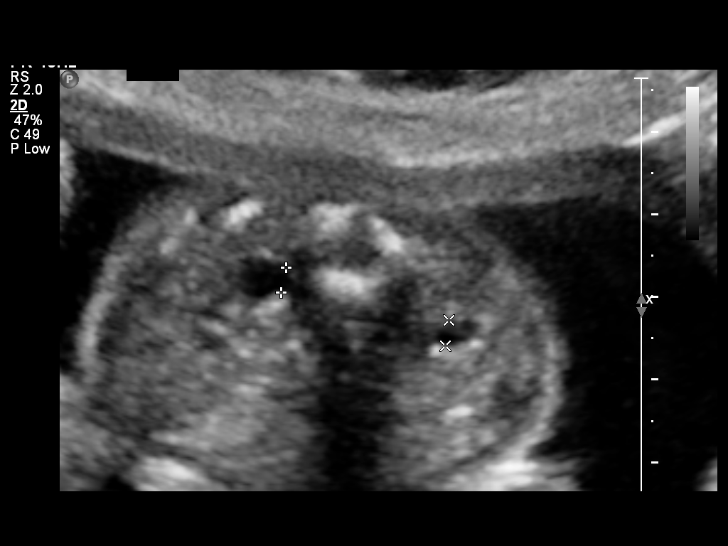
[im 79/82]
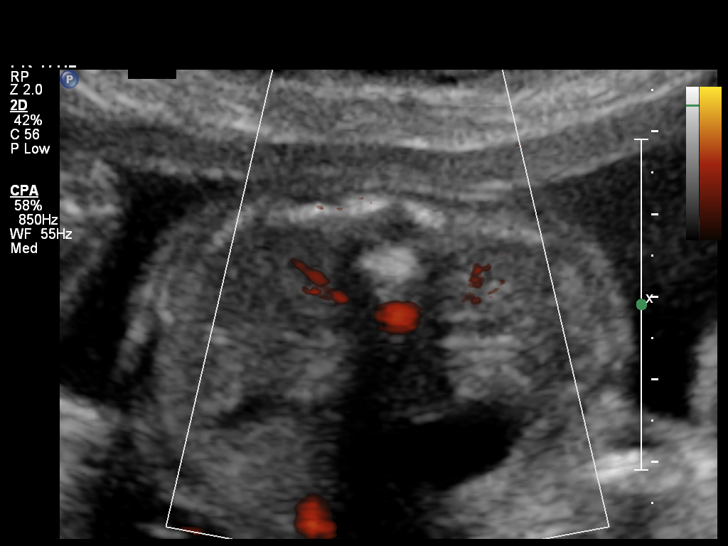

[12 of 28 positions shown; findings below may reference images not displayed]

OBSTETRICS REPORT
                      (Signed Final 02/17/2012 [DATE])

 Order#:         [id number]
Procedures

 US OB COMP + 14 WK                                    76805.1
Indications

 Basic anatomic survey
 No or Little Prenatal Care
Fetal Evaluation

 Fetal Heart Rate:  147                         bpm
 Cardiac Activity:  Observed
 Presentation:      Cephalic
 Placenta:          Posterior, above cervical
                    os
 P. Cord            Visualized
 Insertion:

 Amniotic Fluid
 AFI FV:      Subjectively within normal limits
                                             Larg Pckt:     4.3  cm
Biometry

 BPD:     53.4  mm    G. Age:   22w 2d                CI:        68.63   70 - 86
                                                      FL/HC:      19.9   18.4 -

 HC:       206  mm    G. Age:   22w 5d       61  %    HC/AC:      1.12   1.06 -

 AC:     184.3  mm    G. Age:   23w 2d       76  %    FL/BPD:     76.8   71 - 87
 FL:        41  mm    G. Age:   23w 2d       77  %    FL/AC:      22.2   20 - 24
 CER:       25  mm    G. Age:   23w 0d       63  %
 Est. FW:     568  gm      1 lb 4 oz     63  %
Gestational Age

 LMP:           22w 1d       Date:   09/15/11                 EDD:   06/21/12
 U/S Today:     22w 6d                                        EDD:   06/16/12
 Best:          22w 1d    Det. By:   LMP  (09/15/11)          EDD:   06/21/12
Genetic Sonogram - Trisomy 21 Screening

 Age:                                             26          Risk=1:   719
 Echogenic bowel:                                 No          LR :
 Hypoplastic/absent Nasal bone:                   No
 Choroid plexus cysts:                            No
 Structural anomalies (inc. cardiac):             No          LR :
 Hypoplastic / absent midphalanx 5th Digit:       N/A
 Short femur:                                     No          LR :
 Wide space 3st-9nd toes:                         N/A
 2-vessel umbilical cord:                         No
 Pyelectasis:                                     No          LR :
 Echogenic cardiac foci:                          No          LR :

 8 Of 10 Criteria Were Visualized and 0 Abnormal(s) Were Seen.
 Ultrasound Modified Risk for Fetal Down Syndrome = [DATE]
Anatomy

 Cranium:           Appears normal      Aortic Arch:       Basic anatomy
                                                           exam per order
 Fetal Cavum:       Appears normal      Ductal Arch:       Basic anatomy
                                                           exam per order
 Ventricles:        Appears normal      Diaphragm:         Appears normal
 Choroid Plexus:    Appears normal      Stomach:           Appears
                                                           normal, left
                                                           sided
 Cerebellum:        Appears normal      Abdomen:           Appears normal
 Posterior Fossa:   Appears normal      Abdominal Wall:    Appears nml
                                                           (cord insert,
                                                           abd wall)
 Nuchal Fold:       Not applicable      Cord Vessels:      Appears normal
                    (>20 wks GA)                           (3 vessel cord)
 Face:              Lips and profile    Kidneys:           Appear normal
                    appear normal
 Heart:             Previously seen     Bladder:           Appears normal
 RVOT:              Previously seen     Spine:             Appears normal
 LVOT:              Previously seen     Limbs:             Four extremities
                                                           visualized
                                                           (basic anatomy
                                                           exam)

 Other:     Fetus appears to be a female. Nasal bone visualized.
Targeted Anatomy

 Fetal Central Nervous System
 Lat. Ventricles:   5.5                 Cisterna Magna:
Cervix Uterus Adnexa

 Cervical Length:   3.67      cm

 Cervix:       Normal appearance by transabdominal scan.
 Left Ovary:   Within normal limits.
 Right Ovary:  Within normal limits.

 Adnexa:     No abnormality visualized.
Impression

 Single living IUP.  US EGA (22 w 6d)  is concordant with LMP
 (22w 1d).
 No fetal anatomic abnormality is identified.
 Normal amniotic fluid volume and cervical length.
 questions or concerns.

## 2014-02-27 LAB — OB RESULTS CONSOLE GBS: STREP GROUP B AG: NEGATIVE

## 2014-04-02 ENCOUNTER — Encounter (HOSPITAL_COMMUNITY): Payer: Self-pay | Admitting: *Deleted

## 2014-04-02 ENCOUNTER — Inpatient Hospital Stay (HOSPITAL_COMMUNITY)
Admission: AD | Admit: 2014-04-02 | Discharge: 2014-04-02 | Disposition: A | Discharge status: Home / Self Care | Payer: Medicaid Other | Source: Ambulatory Visit | Attending: Obstetrics | Admitting: Obstetrics

## 2014-04-02 DIAGNOSIS — O4690 Antepartum hemorrhage, unspecified, unspecified trimester: Secondary | ICD-10-CM | POA: Insufficient documentation

## 2014-04-02 DIAGNOSIS — O479 False labor, unspecified: Secondary | ICD-10-CM | POA: Insufficient documentation

## 2014-04-02 HISTORY — DX: Other specified health status: Z78.9

## 2014-04-02 NOTE — Discharge Instructions (Signed)
Contracciones de Braxton Hicks °(Braxton Hicks Contractions) °Durante el embarazo, pueden presentarse contracciones uterinas que no siempre indican que está en trabajo de parto.  °¿QUÉ SON LAS CONTRACCIONES DE BRAXTON HICKS?  °Las contracciones que se presentan antes del trabajo de parto se conocen como contracciones de Braxton Hicks o falso trabajo de parto. Hacia el final del embarazo (32 a 34 semanas), estas contracciones pueden aparecen con más frecuencia y volverse más intensas. No corresponden al trabajo de parto verdadero porque estas contracciones no producen el agrandamiento (la dilatación) y el afinamiento del cuello del útero. Algunas veces, es difícil distinguirlas del trabajo de parto verdadero porque en algunos casos pueden ser muy intensas, y las personas tienen diferentes niveles de tolerancia al dolor. No debe sentirse avergonzada si concurre al hospital con falso trabajo de parto. En ocasiones, la única forma de saber si el trabajo de parto es verdadero es que el médico determine si hay cambios en el cuello del útero. °Si no hay problemas prenatales u otras complicaciones de salud asociadas con el embarazo, no habrá inconvenientes si la envían a su casa con falso trabajo de parto y espera que comience el verdadero. °CÓMO DIFERENCIAR EL TRABAJO DE PARTO FALSO DEL VERDADERO °Falso trabajo de parto °· Las contracciones del falso trabajo de parto duran menos y no son tan intensas como las verdaderas. °· Generalmente son irregulares. °· A menudo, se sienten en la parte delantera de la parte baja del abdomen y en la ingle, °· y pueden desaparecer cuando camina o cambia de posición mientras está acostada. °· Las contracciones se vuelven más débiles y su duración es menor a medida que el tiempo transcurre. °· Por lo general, no se hacen progresivamente más intensas, regulares y cercanas entre sí como en el caso del trabajo de parto verdadero. °Verdadero trabajo de parto °· Las contracciones del verdadero  trabajo de parto duran de 30 a 70 segundos, son muy regulares y suelen volverse más intensas, y aumenta su frecuencia. °· No desaparecen cuando camina. °· La molestia generalmente se siente en la parte superior del útero y se extiende hacia la zona inferior del abdomen y hacia la cintura. °· El médico podrá examinarla para determinar si el trabajo de parto es verdadero. El examen mostrará si el cuello del útero se está dilatando y afinando. °LO QUE DEBE RECORDAR °· Continúe haciendo los ejercicios habituales y siga otras indicaciones que el médico le dé. °· Tome todos los medicamentos como le indicó el médico. °· Concurra a las visitas prenatales regulares. °· Coma y beba con moderación si cree que está en trabajo de parto. °· Si las contracciones de Braxton Hicks le provocan incomodidad: °¨ Cambie de posición: si está acostada o descansando, camine; si está caminando, descanse. °¨ Siéntese y descanse en una bañera con agua tibia. °¨ Beba 2 o 3 vasos de agua. La deshidratación puede provocar contracciones. °¨ Respire lenta y profundamente varias veces por hora. °¿CUÁNDO DEBO BUSCAR ASISTENCIA MÉDICA INMEDIATA? °Solicite atención médica de inmediato si: °· Las contracciones se intensifican, se hacen más regulares y cercanas entre sí. °· Tiene una pérdida de líquido por la vagina. °· Tiene fiebre. °· Elimina mucosidad manchada con sangre. °· Tiene una hemorragia vaginal abundante. °· Tiene dolor abdominal permanente. °· Tiene un dolor en la zona lumbar que nunca tuvo antes. °· Siente que la cabeza del bebé empuja hacia abajo y ejerce presión en la zona pélvica. °· El bebé no se mueve tanto como solía. °Document Released: 06/03/2005 Document Revised: 08/29/2013 °Exit[employer]® Patient   Information ©2015 [employer]. This information is not intended to replace advice given to you by your health care provider. Make sure you discuss any questions you have with your health care provider. ° °Evaluación de los movimientos  fetales  °(Fetal Movement Counts) °Nombre del paciente: __________________________________________________ Fecha de parto estimada: ____________________ °La evaluación de los movimientos fetales es muy recomendable en los embarazos de alto riesgo, pero también es una buena idea que lo hagan todas las embarazadas. El médico le indicará que comience a contarlos a las 28 semanas de embarazo. Los movimientos fetales suelen aumentar:  °· Después de una comida completa. °· Después de la actividad física. °· Después de comer o beber algo dulce o frío. °· En reposo. °Preste atención cuando sienta que el bebé está más activo. Esto le ayudará a notar un patrón de ciclos de vigilia y sueño de su bebé y cuáles son los factores que contribuyen a un aumento de los movimientos fetales. Es importante llevar a cabo un recuento de movimientos fetales, al mismo tiempo cada día, cuando el bebé normalmente está más activo.  °CÓMO CONTAR LOS MOVIMIENTOS FETALES °1. Busque un lugar tranquilo y cómodo para sentarse o recostarse sobre el lado izquierdo. Al recostarse sobre su lado izquierdo, le proporciona una mejor circulación de sangre y oxígeno al bebé. °2. Anote el día y la hora en una hoja de papel o en un diario. °3. Comience contando las pataditas, revoloteos, chasquidos, vueltas o pinchazos en un período de 2 horas. Debe sentir al menos 10 movimientos en 2 horas. °4. Si no siente 10 movimientos en 2 horas, espere 2 ó 3 horas y cuente de nuevo. Busque cambios en el patrón o si no cuenta lo suficiente en 2 horas. °SOLICITE ATENCIÓN MÉDICA SI:  °· Siente menos de 10 pataditas en 2 horas, en dos intentos. °· No hay movimientos durante una hora. °· El patrón se modifica o le lleva más tiempo cada día contar las 10 pataditas. °· Siente que el bebé no se mueve como lo hace habitualmente. °Fecha: ____________ Movimientos: ____________ Hora de inicio: ____________ Hora de finalización: ____________  °Fecha: ____________ Movimientos:  ____________ Hora de inicio: ____________ Hora de finalización: ____________  °Fecha: ____________ Movimientos: ____________ Hora de inicio: ____________ Hora de finalización: ____________  °Fecha: ____________ Movimientos: ____________ Hora de inicio: ____________ Hora de finalización: ____________  °Fecha: ____________ Movimientos: ____________ Hora de inicio: ____________ Hora de finalización: ____________  °Fecha: ____________ Movimientos: ____________ Hora de inicio: ____________ Hora de finalización: ____________  °Fecha: ____________ Movimientos: ____________ Hora de inicio: ____________ Hora de finalización: ____________  °Fecha: ____________ Movimientos: ____________ Hora de inicio: ____________ Hora de finalización: ____________  °Fecha: ____________ Movimientos: ____________ Hora de inicio: ____________ Hora de finalización: ____________  °Fecha: ____________ Movimientos: ____________ Hora de inicio: ____________ Hora de finalización: ____________  °Fecha: ____________ Movimientos: ____________ Hora de inicio: ____________ Hora de finalización: ____________  °Fecha: ____________ Movimientos: ____________ Hora de inicio: ____________ Hora de finalización: ____________  °Fecha: ____________ Movimientos: ____________ Hora de inicio: ____________ Hora de finalización: ____________  °Fecha: ____________ Movimientos: ____________ Hora de inicio: ____________ Hora de finalización: ____________  °Fecha: ____________ Movimientos: ____________ Hora de inicio: ____________ Hora de finalización: ____________  °Fecha: ____________ Movimientos: ____________ Hora de inicio: ____________ Hora de finalización: ____________  °Fecha: ____________ Movimientos: ____________ Hora de inicio: ____________ Hora de finalización: ____________  °Fecha: ____________ Movimientos: ____________ Hora de inicio: ____________ Hora de finalización: ____________  °Fecha: ____________ Movimientos: ____________ Hora de inicio: ____________  Hora de finalización: ____________  °Fecha:   ____________ Movimientos: ____________ Hora de inicio: ____________ Hora de finalización: ____________  °Fecha: ____________ Movimientos: ____________ Hora de inicio: ____________ Hora de finalización: ____________  °Fecha: ____________ Movimientos: ____________ Hora de inicio: ____________ Hora de finalización: ____________  °Fecha: ____________ Movimientos: ____________ Hora de inicio: ____________ Hora de finalización: ____________  °Fecha: ____________ Movimientos: ____________ Hora de inicio: ____________ Hora de finalización: ____________  °Fecha: ____________ Movimientos: ____________ Hora de inicio: ____________ Hora de finalización: ____________  °Fecha: ____________ Movimientos: ____________ Hora de inicio: ____________ Hora de finalización: ____________  °Fecha: ____________ Movimientos: ____________ Hora de inicio: ____________ Hora de finalización: ____________  °Fecha: ____________ Movimientos: ____________ Hora de inicio: ____________ Hora de finalización: ____________  °Fecha: ____________ Movimientos: ____________ Hora de inicio: ____________ Hora de finalización: ____________  °Fecha: ____________ Movimientos: ____________ Hora de inicio: ____________ Hora de finalización: ____________  °Fecha: ____________ Movimientos: ____________ Hora de inicio: ____________ Hora de finalización: ____________  °Fecha: ____________ Movimientos: ____________ Hora de inicio: ____________ Hora de finalización: ____________  °Fecha: ____________ Movimientos: ____________ Hora de inicio: ____________ Hora de finalización: ____________  °Fecha: ____________ Movimientos: ____________ Hora de inicio: ____________ Hora de finalización: ____________  °Fecha: ____________ Movimientos: ____________ Hora de inicio: ____________ Hora de finalización: ____________  °Fecha: ____________ Movimientos: ____________ Hora de inicio: ____________ Hora de finalización: ____________  °Fecha:  ____________ Movimientos: ____________ Hora de inicio: ____________ Hora de finalización: ____________  °Fecha: ____________ Movimientos: ____________ Hora de inicio: ____________ Hora de finalización: ____________  °Fecha: ____________ Movimientos: ____________ Hora de inicio: ____________ Hora de finalización: ____________  °Fecha: ____________ Movimientos: ____________ Hora de inicio: ____________ Hora de finalización: ____________  °Fecha: ____________ Movimientos: ____________ Hora de inicio: ____________ Hora de finalización: ____________  °Fecha: ____________ Movimientos: ____________ Hora de inicio: ____________ Hora de finalización: ____________  °Fecha: ____________ Movimientos: ____________ Hora de inicio: ____________ Hora de finalización: ____________  °Fecha: ____________ Movimientos: ____________ Hora de inicio: ____________ Hora de finalización: ____________  °Fecha: ____________ Movimientos: ____________ Hora de inicio: ____________ Hora de finalización: ____________  °Fecha: ____________ Movimientos: ____________ Hora de inicio: ____________ Hora de finalización: ____________  °Fecha: ____________ Movimientos: ____________ Hora de inicio: ____________ Hora de finalización: ____________  °Fecha: ____________ Movimientos: ____________ Hora de inicio: ____________ Hora de finalización: ____________  °Fecha: ____________ Movimientos: ____________ Hora de inicio: ____________ Hora de finalización: ____________  °Fecha: ____________ Movimientos: ____________ Hora de inicio: ____________ Hora de finalización: ____________  °Fecha: ____________ Movimientos: ____________ Hora de inicio: ____________ Hora de finalización: ____________  °Fecha: ____________ Movimientos: ____________ Hora de inicio: ____________ Hora de finalización: ____________  °Fecha: ____________ Movimientos: ____________ Hora de inicio: ____________ Hora de finalización: ____________  °Fecha: ____________ Movimientos: ____________ Hora  de inicio: ____________ Hora de finalización: ____________  °Fecha: ____________ Movimientos: ____________ Hora de inicio: ____________ Hora de finalización: ____________  °Fecha: ____________ Movimientos: ____________ Hora de inicio: ____________ Hora de finalización: ____________  °Document Released: 12/01/2007 Document Revised: 08/10/2012 °[employer]® Patient Information ©2015 [employer]. This information is not intended to replace advice given to you by your health care provider. Make sure you discuss any questions you have with your health care provider. ° °

## 2014-04-02 NOTE — MAU Note (Signed)
Contractions and some bleeding

## 2014-04-03 ENCOUNTER — Inpatient Hospital Stay (HOSPITAL_COMMUNITY)
Admission: AD | Admit: 2014-04-03 | Discharge: 2014-04-05 | DRG: 775 | Disposition: A | Discharge status: Home / Self Care | Payer: Medicaid Other | Source: Ambulatory Visit | Attending: Obstetrics | Admitting: Obstetrics

## 2014-04-03 ENCOUNTER — Encounter (HOSPITAL_COMMUNITY): Payer: Self-pay

## 2014-04-03 DIAGNOSIS — O479 False labor, unspecified: Secondary | ICD-10-CM | POA: Diagnosis present

## 2014-04-03 DIAGNOSIS — IMO0001 Reserved for inherently not codable concepts without codable children: Secondary | ICD-10-CM

## 2014-04-03 MED ORDER — OXYTOCIN BOLUS FROM INFUSION
500.0000 mL | INTRAVENOUS | Status: DC
  Administered 2014-04-04: 500 mL via INTRAVENOUS

## 2014-04-03 MED ORDER — LIDOCAINE HCL (PF) 1 % IJ SOLN
30.0000 mL | INTRAMUSCULAR | Status: DC | PRN
  Filled 2014-04-03: qty 30

## 2014-04-03 MED ORDER — LIDOCAINE HCL (PF) 1 % IJ SOLN
INTRAMUSCULAR | Status: AC
  Filled 2014-04-03: qty 30

## 2014-04-03 MED ORDER — OXYCODONE-ACETAMINOPHEN 5-325 MG PO TABS: 1.0000 | ORAL_TABLET | ORAL | Status: DC | PRN

## 2014-04-03 MED ORDER — IBUPROFEN 600 MG PO TABS: 600.0000 mg | ORAL_TABLET | Freq: Four times a day (QID) | ORAL | Status: DC | PRN

## 2014-04-03 MED ORDER — FLEET ENEMA 7-19 GM/118ML RE ENEM: 1.0000 | ENEMA | RECTAL | Status: DC | PRN

## 2014-04-03 MED ORDER — CITRIC ACID-SODIUM CITRATE 334-500 MG/5ML PO SOLN: 30.0000 mL | ORAL | Status: DC | PRN

## 2014-04-03 MED ORDER — LACTATED RINGERS IV SOLN: 500.0000 mL | INTRAVENOUS | Status: DC | PRN

## 2014-04-03 MED ORDER — OXYTOCIN 40 UNITS IN LACTATED RINGERS INFUSION - SIMPLE MED
INTRAVENOUS | Status: AC
  Filled 2014-04-03: qty 1000

## 2014-04-03 MED ORDER — LACTATED RINGERS IV SOLN
INTRAVENOUS | Status: DC
  Administered 2014-04-03: via INTRAVENOUS

## 2014-04-03 MED ORDER — OXYTOCIN 40 UNITS IN LACTATED RINGERS INFUSION - SIMPLE MED: 62.5000 mL/h | INTRAVENOUS | Status: DC

## 2014-04-03 MED ORDER — ACETAMINOPHEN 325 MG PO TABS: 650.0000 mg | ORAL_TABLET | ORAL | Status: DC | PRN

## 2014-04-03 MED ORDER — ONDANSETRON HCL 4 MG/2ML IJ SOLN: 4.0000 mg | Freq: Four times a day (QID) | INTRAMUSCULAR | Status: DC | PRN

## 2014-04-03 NOTE — MAU Note (Signed)
Contractions, some bleeding.  

## 2014-04-03 NOTE — MAU Note (Signed)
Pt complete with a bulging bag, [facility], pt to 171 per stretcher with Mathews RobinsonsHogan, CNM and RN.

## 2014-04-04 ENCOUNTER — Encounter (HOSPITAL_COMMUNITY): Payer: Self-pay

## 2014-04-04 LAB — TYPE AND SCREEN
ABO/RH(D): O POS
Antibody Screen: NEGATIVE

## 2014-04-04 LAB — CBC
HCT: 35.1 % — ABNORMAL LOW (ref 36.0–46.0)
HCT: 36.7 % (ref 36.0–46.0)
HEMOGLOBIN: 11.6 g/dL — AB (ref 12.0–15.0)
Hemoglobin: 12.1 g/dL (ref 12.0–15.0)
MCH: 27.1 pg (ref 26.0–34.0)
MCH: 27.2 pg (ref 26.0–34.0)
MCHC: 33 g/dL (ref 30.0–36.0)
MCHC: 33 g/dL (ref 30.0–36.0)
MCV: 82.3 fL (ref 78.0–100.0)
MCV: 82.4 fL (ref 78.0–100.0)
PLATELETS: 337 10*3/uL (ref 150–400)
Platelets: 344 10*3/uL (ref 150–400)
RBC: 4.26 MIL/uL (ref 3.87–5.11)
RBC: 4.46 MIL/uL (ref 3.87–5.11)
RDW: 16.8 % — ABNORMAL HIGH (ref 11.5–15.5)
RDW: 16.9 % — ABNORMAL HIGH (ref 11.5–15.5)
WBC: 18.6 10*3/uL — ABNORMAL HIGH (ref 4.0–10.5)
WBC: 19 10*3/uL — ABNORMAL HIGH (ref 4.0–10.5)

## 2014-04-04 LAB — RPR

## 2014-04-04 MED ORDER — DIPHENHYDRAMINE HCL 25 MG PO CAPS: 25.0000 mg | ORAL_CAPSULE | Freq: Four times a day (QID) | ORAL | Status: DC | PRN

## 2014-04-04 MED ORDER — ONDANSETRON HCL 4 MG PO TABS: 4.0000 mg | ORAL_TABLET | ORAL | Status: DC | PRN

## 2014-04-04 MED ORDER — FERROUS SULFATE 325 (65 FE) MG PO TABS
325.0000 mg | ORAL_TABLET | Freq: Two times a day (BID) | ORAL | Status: DC
  Administered 2014-04-04 – 2014-04-05 (×3): 325 mg via ORAL
  Filled 2014-04-04 (×3): qty 1

## 2014-04-04 MED ORDER — ONDANSETRON HCL 4 MG/2ML IJ SOLN: 4.0000 mg | INTRAMUSCULAR | Status: DC | PRN

## 2014-04-04 MED ORDER — PRENATAL MULTIVITAMIN CH
1.0000 | ORAL_TABLET | Freq: Every day | ORAL | Status: DC
  Administered 2014-04-04 – 2014-04-05 (×2): 1 via ORAL
  Filled 2014-04-04 (×2): qty 1

## 2014-04-04 MED ORDER — WITCH HAZEL-GLYCERIN EX PADS: 1.0000 "application " | MEDICATED_PAD | CUTANEOUS | Status: DC | PRN

## 2014-04-04 MED ORDER — IBUPROFEN 600 MG PO TABS
600.0000 mg | ORAL_TABLET | Freq: Four times a day (QID) | ORAL | Status: DC
  Administered 2014-04-04 – 2014-04-05 (×6): 600 mg via ORAL
  Filled 2014-04-04 (×6): qty 1

## 2014-04-04 MED ORDER — OXYCODONE-ACETAMINOPHEN 5-325 MG PO TABS: 1.0000 | ORAL_TABLET | ORAL | Status: DC | PRN

## 2014-04-04 MED ORDER — SENNOSIDES-DOCUSATE SODIUM 8.6-50 MG PO TABS
2.0000 | ORAL_TABLET | ORAL | Status: DC
  Administered 2014-04-04: 2 via ORAL
  Filled 2014-04-04: qty 2

## 2014-04-04 MED ORDER — DIBUCAINE 1 % RE OINT: 1.0000 "application " | TOPICAL_OINTMENT | RECTAL | Status: DC | PRN

## 2014-04-04 MED ORDER — LANOLIN HYDROUS EX OINT: TOPICAL_OINTMENT | CUTANEOUS | Status: DC | PRN

## 2014-04-04 MED ORDER — SIMETHICONE 80 MG PO CHEW: 80.0000 mg | CHEWABLE_TABLET | ORAL | Status: DC | PRN

## 2014-04-04 MED ORDER — BENZOCAINE-MENTHOL 20-0.5 % EX AERO
1.0000 | INHALATION_SPRAY | CUTANEOUS | Status: DC | PRN
Start: 2014-04-04 — End: 2014-04-05

## 2014-04-04 MED ORDER — ZOLPIDEM TARTRATE 5 MG PO TABS: 5.0000 mg | ORAL_TABLET | Freq: Every evening | ORAL | Status: DC | PRN

## 2014-04-04 MED ORDER — TETANUS-DIPHTH-ACELL PERTUSSIS 5-2.5-18.5 LF-MCG/0.5 IM SUSP
0.5000 mL | Freq: Once | INTRAMUSCULAR | Status: AC
  Administered 2014-04-04: 0.5 mL via INTRAMUSCULAR
  Filled 2014-04-04: qty 0.5

## 2014-04-04 NOTE — Progress Notes (Signed)
Ur chart review completed.  

## 2014-04-04 NOTE — H&P (Signed)
This is Dr. Francoise CeoBernard [facility] dictating the history and physical on  Tina Pruitt she's a 28 year old gravida 3 para 202 at 40 weeks [facility] GBS started labor at 5 PM admitted for the dilated and -1 station membranes ruptured artificially fluid clear patient progressed satisfactorily and and and normal vaginal delivery of a female Apgar 9 and 9 placenta spontaneous intact no episiotomy or lacerations Past medical history negative Past surgical history Social history negative System review negative Physical exam well-developed female post delivery HEENT negative Lungs clear to P&A Heart regular rhythm no murmurs no gallops Breasts engorged Uterus 20 week postpartum size Extremities negative and

## 2014-04-04 NOTE — Progress Notes (Signed)
Interpreter Sharolyn Douglasebbie Salazar used.  Explained plan of care, medications, and answered patient's questions.

## 2014-04-04 NOTE — Lactation Note (Signed)
This note was copied from the chart of Tina [name] Vickers. Lactation Consultation Note  Interpreter Eda present. Mother bf last chidl for 6 months with formula supplementation. Mother has baby STS after bath.  He has been sleepy. Mother states breastfeeding going well.  Reviewed how to hand express.  Mother states she has been taught. Mom encouraged to feed baby 8-12 times/24 hours and with feeding cues.  Mom made aware of O/P services, breastfeeding support groups, community resources, and our phone # for post-discharge questions.  Will call to view latch later today.    Patient Name: Tina Pruitt GNFAO'ZToday's Date: 04/04/2014 Reason for consult: Follow-up assessment;Initial assessment   Maternal Data Has patient been taught Hand Expression?: Yes Does the patient have breastfeeding experience prior to this delivery?: Yes  Feeding    LATCH Score/Interventions                      Lactation Tools Discussed/Used     Consult Status Consult Status: Follow-up Date: 04/05/14 Follow-up type: In-patient    Dahlia ByesBerkelhammer, [facility] 04/04/2014, 9:11 AM

## 2014-04-04 NOTE — Progress Notes (Signed)
Patient ID: Tina HolmYanet Broadfoot, female   DOB: 09/21/1985, 28 y.o.   MRN: [id number]   postpartum day 0 Vital signs normal Fundus firm Lochia moderate Legs negative Doing well

## 2014-04-05 NOTE — Discharge Summary (Signed)
Obstetric Discharge Summary Reason for Admission: onset of labor Prenatal Procedures: none Intrapartum Procedures: spontaneous vaginal delivery Postpartum Procedures: none Complications-Operative and Postpartum: none Hemoglobin  Date Value Ref Range Status  04/04/2014 11.6* 12.0 - 15.0 g/dL Final     HCT  Date Value Ref Range Status  04/04/2014 35.1* 36.0 - 46.0 % Final    Physical Exam:  General: alert Lochia: appropriate Uterine Fundus: firm Incision: healing well DVT Evaluation: No evidence of DVT seen on physical exam.  Discharge Diagnoses: Term Pregnancy-delivered  Discharge Information: Date: 04/05/2014 Activity: pelvic rest Diet: routine Medications: Percocet Condition: stable Instructions: refer to practice specific booklet Discharge to: home Follow-up Information   Follow up with Kathreen CosierMARSHALL,Saylah Ketner A, MD.   Specialty:  Obstetrics and Gynecology   Contact information:   [address] [place] [address] [phone]       Newborn Data: Live born female  Birth Weight: 6 lb 10 oz (3005 g) APGAR: 9, 9  Home with mother.  Prisha Hiley A 04/05/2014, 6:39 AM

## 2014-04-05 NOTE — Progress Notes (Signed)
Interpreter offered - pt. Declined at this time 

## 2014-04-05 NOTE — Discharge Instructions (Signed)
Discharge instructions   You can wash your hair  Shower  Eat what you want  Drink what you want  See me in 6 weeks  Your ankles are going to swell more in the next 2 weeks than when pregnant  No sex for 6 weeks   Edison Wollschlager A, MD 04/05/2014

## 2014-04-05 NOTE — Lactation Note (Signed)
This note was copied from the chart of Boy [name] Drumwright. Lactation Consultation Note  Patient Name: [name] ZOXWR'UToday's Date: 04/05/2014 Reason for consult: Follow-up assessment;Other (Comment) [facility](Eda Royal interpreter present ) Baby present latched with depth , swallows noted and per mom comfortable .  Per mom nipples are tender initially and improve once the baby is latches and feeding. Reviewed sore nipple and engorgement prevention and tx , instructed on the use of comfort gels,hand pump , with demo. #42 Flange the correct fit. Mother informed of post-discharge support and given phone number to the lactation department, including services for phone call assistance; out-patient appointments; and breastfeeding support group. List of other breastfeeding resources in the community given in the handout. Encouraged mother to call for problems or concerns related to breastfeeding.   Maternal Data Formula Feeding for Exclusion: No Does the patient have breastfeeding experience prior to this delivery?: Yes  Feeding Feeding Type:  (baby presently feeding at the breast with swallows )  LATCH Score/Interventions Latch:  (latched with depth )  Audible Swallowing:  (swallows noted )     Comfort (Breast/Nipple):  (per mom intially nipples tender with latch )  Problem noted:  (filling )  Hold (Positioning):  (mom independent with latch )     Lactation Tools Discussed/Used [facility] Pump Review: Setup, frequency, and cleaning Initiated by:: MAI  Date initiated:: 04/05/14   Consult Status Consult Status: Complete    Kathrin Greathouseorio, [name] 04/05/2014, 10:50 AM

## 2014-04-05 NOTE — Progress Notes (Signed)
Patient ID: Tina Pruitt, female   DOB: 06/13/1986, 28 y.o.   MRN: [id number] Postpartum day one Vital signs normal Fundus firm Lochia moderate Legs negative Wants her to discharge

## 2014-04-06 ENCOUNTER — Inpatient Hospital Stay (HOSPITAL_COMMUNITY): Admission: RE | Admit: 2014-04-06 | Payer: Medicaid Other | Source: Ambulatory Visit

## 2014-07-09 ENCOUNTER — Encounter (HOSPITAL_COMMUNITY): Payer: Self-pay

## 2015-05-15 ENCOUNTER — Encounter: Payer: Medicaid Other | Admitting: Obstetrics & Gynecology

## 2016-05-29 ENCOUNTER — Encounter: Payer: Self-pay | Admitting: *Deleted

## 2021-01-24 ENCOUNTER — Other Ambulatory Visit (HOSPITAL_COMMUNITY): Payer: Self-pay

## 2021-01-27 ENCOUNTER — Ambulatory Visit: Admit: 2021-01-27 | Payer: Self-pay | Admitting: General Surgery

## 2021-01-30 ENCOUNTER — Ambulatory Visit: Payer: Self-pay | Admitting: General Surgery

## 2021-02-10 NOTE — Patient Instructions (Addendum)
DUE TO COVID-19 ONLY ONE VISITOR IS ALLOWED TO COME WITH YOU AND STAY IN THE WAITING ROOM ONLY DURING PRE OP AND PROCEDURE.   **NO VISITORS ARE ALLOWED IN THE SHORT STAY AREA OR RECOVERY ROOM!!**          Your procedure is scheduled on: 02/24/21   Report to [facility] Main  Entrance   Report to Short Stay at 5:15 AM   [facility])   Call this number if you have problems the morning of surgery [phone]   Do not eat food :After Midnight.   May have liquids until  4:30 AM  day of surgery  CLEAR LIQUID DIET  Foods Allowed                                                                     Foods Excluded  Water, Black Coffee and tea, regular and decaf             liquids that you cannot  Plain Jell-O in any flavor  (No red)                                    see through such as: Fruit ices (not with fruit pulp)                                            milk, soups, orange juice              Iced Popsicles (No red)                                                All solid food                                   Apple juices Sports drinks like Gatorade (No red) Lightly seasoned clear broth or consume(fat free) Sugar, honey syrup    Complete one Ensure drink the morning of surgery at 4:30 AM  the day of surgery.      1. The day of surgery:  ? Drink ONE (1) Pre-Surgery Clear Ensure by 4:30 am the morning of surgery. Drink in one sitting. Do not sip.  ? This drink was given to you during your hospital  pre-op appointment visit. ? Nothing else to drink after completing the  Pre-Surgery Clear Ensure.          If you have questions, please contact your surgeon's office.     Oral Hygiene is also important to reduce your risk of infection.                                    Remember - BRUSH YOUR TEETH THE MORNING OF SURGERY WITH YOUR REGULAR TOOTHPASTE   Do NOT smoke after Midnight  You may not have any metal on your body including hair  pins, jewelry, and body piercing             Do not wear make-up, lotions, powders, perfumes, or deodorant  Do not wear nail polish including gel and S&S, artificial/acrylic nails, or any other type of covering on natural nails including finger and  toenails. If you have artificial nails, gel coating, etc. that needs to be removed by a nail salon please have this removed prior to surgery or surgery  may need to be canceled/ delayed if the surgeon/ anesthesia feels like they are unable to be safely monitored.   Do not shave  48 hours prior to surgery.               Do not bring valuables to the hospital. [facility] IS NOT             RESPONSIBLE   FOR VALUABLES.   Contacts, dentures or bridgework may not be worn into surgery.    Patients discharged the day of surgery will not be allowed to drive home.   Special Instructions: Bring a copy of your healthcare power of attorney and living will documents         the day of surgery if you haven't  scanned them  in before.              Please read over the following fact sheets you were given: IF YOU HAVE QUESTIONS ABOUT YOUR PRE OP INSTRUCTIONS PLEASE  CALL  [phone]   [place] - Preparing for Surgery Before surgery, you can play an important role.  Because skin is not sterile, your skin needs to be as free of germs as possible.  You can reduce the number of germs on your skin by washing with CHG (chlorahexidine gluconate) soap before surgery.  CHG is an antiseptic cleaner which kills germs and bonds with the skin to continue killing germs even after washing. Please DO NOT use if you have an allergy to CHG or antibacterial soaps.  If your skin becomes reddened/irritated stop using the CHG and inform your nurse when you arrive at Short Stay. Do not shave (including legs and underarms) for at least 48 hours prior to the first CHG shower.  You may shave your face/neck.  Please follow these instructions carefully:  1.  Shower with CHG Soap  the night before surgery and the  morning of surgery.  2.  If you choose to wash your hair, wash your hair first as usual with your normal  shampoo.  3.  After you shampoo, rinse your hair and body thoroughly to remove the shampoo.                             4.  Use CHG as you would any other liquid soap.  You can apply chg directly to the skin and wash.  Gently with a scrungie or clean washcloth.  5.  Apply the CHG Soap to your body ONLY FROM THE NECK DOWN.   Do   not use on face/ open                           Wound or open sores. Avoid contact with eyes, ears mouth and   genitals (private parts).  Wash face,  Genitals (private parts) with your normal soap.             6.  Wash thoroughly, paying special attention to the area where your    surgery  will be performed.  7.  Thoroughly rinse your body with warm water from the neck down.  8.  DO NOT shower/wash with your normal soap after using and rinsing off the CHG Soap.                9.  Pat yourself dry with a clean towel.            10.  Wear clean pajamas.            11.  Place clean sheets on your bed the night of your first shower and do not  sleep with pets. Day of Surgery : Do not apply any lotions/deodorants the morning of surgery.  Please wear clean clothes to the hospital/surgery center.  FAILURE TO FOLLOW THESE INSTRUCTIONS MAY RESULT IN THE CANCELLATION OF YOUR SURGERY  PATIENT SIGNATURE_________________________________  NURSE SIGNATURE__________________________________  ________________________________________________________________________

## 2021-02-13 ENCOUNTER — Encounter (HOSPITAL_COMMUNITY): Payer: Self-pay

## 2021-02-13 ENCOUNTER — Other Ambulatory Visit: Payer: Self-pay

## 2021-02-13 ENCOUNTER — Encounter (HOSPITAL_COMMUNITY)
Admission: RE | Admit: 2021-02-13 | Discharge: 2021-02-13 | Disposition: A | Discharge status: Home / Self Care | Payer: Self-pay | Source: Ambulatory Visit | Attending: General Surgery | Admitting: General Surgery

## 2021-02-13 NOTE — Progress Notes (Addendum)
COVID Vaccine Completed: Yes x3 Date COVID Vaccine completed: Has received booster: COVID vaccine manufacturer: Pfizer     Date of COVID positive in last 90 days: N/A  PCP - Sherron Flemings Cardiologist - N/A  Chest x-ray - N/A EKG - N/A Stress Test - N/A ECHO - N/A Cardiac Cath - N/A Pacemaker/ICD device last checked: Spinal Cord Stimulator:  Sleep Study - N/A CPAP -   Fasting Blood Sugar - N/A Checks Blood Sugar _____ times a day  Blood Thinner Instructions: N/A Aspirin Instructions: Last Dose:  Activity level:  Can go up a flight of stairs and perform activities of daily living without stopping and without symptoms of chest pain or shortness of breath.  Able to exercise without symptoms   Anesthesia review: N/A  Patient denies shortness of breath, fever, cough and chest pain at PAT appointment   Patient verbalized understanding of instructions that were given to them at the PAT appointment. Patient was also instructed that they will need to review over the PAT instructions again at home before surgery.

## 2021-02-21 ENCOUNTER — Other Ambulatory Visit (HOSPITAL_COMMUNITY): Payer: Self-pay

## 2021-02-23 NOTE — Anesthesia Preprocedure Evaluation (Addendum)
Anesthesia Evaluation  Patient identified by MRN, date of birth, ID band Patient awake    Reviewed: Allergy & Precautions, NPO status , Patient's Chart, lab work & pertinent test results  Airway Mallampati: IV  TM Distance: >3 FB Neck ROM: Full    Dental no notable dental hx.    Pulmonary neg pulmonary ROS,    Pulmonary exam normal breath sounds clear to auscultation       Cardiovascular negative cardio ROS Normal cardiovascular exam Rhythm:Regular Rate:Normal     Neuro/Psych negative neurological ROS  negative psych ROS   GI/Hepatic negative GI ROS, Neg liver ROS,   Endo/Other  negative endocrine ROS  Renal/GU negative Renal ROS     Musculoskeletal negative musculoskeletal ROS (+)   Abdominal   Peds  Hematology negative hematology ROS (+)   Anesthesia Other Findings UMBILICAL HERNIA  Reproductive/Obstetrics hcg negative                            Anesthesia Physical Anesthesia Plan  ASA: 2  Anesthesia Plan: General   Post-op Pain Management:    Induction: Intravenous  PONV Risk Score and Plan: 3 and Ondansetron, Dexamethasone, Midazolam and Treatment may vary due to age or medical condition  Airway Management Planned: Oral ETT  Additional Equipment:   Intra-op Plan:   Post-operative Plan: Extubation in OR  Informed Consent: I have reviewed the patients History and Physical, chart, labs and discussed the procedure including the risks, benefits and alternatives for the proposed anesthesia with the patient or authorized representative who has indicated his/her understanding and acceptance.     Dental advisory given and Interpreter used for interveiw  Plan Discussed with: CRNA  Anesthesia Plan Comments:        Anesthesia Quick Evaluation

## 2021-02-24 ENCOUNTER — Ambulatory Visit (HOSPITAL_COMMUNITY): Payer: 59 | Admitting: Anesthesiology

## 2021-02-24 ENCOUNTER — Encounter (HOSPITAL_COMMUNITY): Payer: Self-pay | Admitting: General Surgery

## 2021-02-24 ENCOUNTER — Ambulatory Visit (HOSPITAL_COMMUNITY)
Admission: RE | Admit: 2021-02-24 | Discharge: 2021-02-24 | Disposition: A | Discharge status: Home / Self Care | Payer: 59 | Attending: General Surgery | Admitting: General Surgery

## 2021-02-24 ENCOUNTER — Encounter (HOSPITAL_COMMUNITY)
Admission: RE | Disposition: A | Discharge status: Home / Self Care | Payer: Self-pay | Source: Home / Self Care | Attending: General Surgery

## 2021-02-24 DIAGNOSIS — K439 Ventral hernia without obstruction or gangrene: Secondary | ICD-10-CM | POA: Diagnosis not present

## 2021-02-24 DIAGNOSIS — K429 Umbilical hernia without obstruction or gangrene: Secondary | ICD-10-CM | POA: Insufficient documentation

## 2021-02-24 HISTORY — PX: UMBILICAL HERNIA REPAIR: SHX196

## 2021-02-24 LAB — CBC
HCT: 38.7 % (ref 36.0–46.0)
Hemoglobin: 13.1 g/dL (ref 12.0–15.0)
MCH: 31.1 pg (ref 26.0–34.0)
MCHC: 33.9 g/dL (ref 30.0–36.0)
MCV: 91.9 fL (ref 80.0–100.0)
Platelets: 395 10*3/uL (ref 150–400)
RBC: 4.21 MIL/uL (ref 3.87–5.11)
RDW: 12 % (ref 11.5–15.5)
WBC: 6.9 10*3/uL (ref 4.0–10.5)
nRBC: 0 % (ref 0.0–0.2)

## 2021-02-24 LAB — PREGNANCY, URINE: Preg Test, Ur: NEGATIVE

## 2021-02-24 SURGERY — REPAIR, HERNIA, UMBILICAL, ADULT
Anesthesia: General

## 2021-02-24 SURGERY — REPAIR, HERNIA, UMBILICAL, ADULT
Anesthesia: General | Site: Abdomen

## 2021-02-24 MED ORDER — ENSURE PRE-SURGERY PO LIQD
296.0000 mL | Freq: Once | ORAL | Status: DC
  Filled 2021-02-24: qty 296

## 2021-02-24 MED ORDER — KETAMINE HCL 10 MG/ML IJ SOLN
INTRAMUSCULAR | Status: DC | PRN
  Administered 2021-02-24: 30 mg via INTRAVENOUS

## 2021-02-24 MED ORDER — OXYCODONE HCL 5 MG PO TABS: 5.0000 mg | ORAL_TABLET | Freq: Once | ORAL | Status: AC | PRN

## 2021-02-24 MED ORDER — KETAMINE HCL 10 MG/ML IJ SOLN
INTRAMUSCULAR | Status: AC
  Filled 2021-02-24: qty 1

## 2021-02-24 MED ORDER — ROCURONIUM BROMIDE 10 MG/ML (PF) SYRINGE
PREFILLED_SYRINGE | INTRAVENOUS | Status: AC
  Filled 2021-02-24: qty 10

## 2021-02-24 MED ORDER — CHLORHEXIDINE GLUCONATE CLOTH 2 % EX PADS: 6.0000 | MEDICATED_PAD | Freq: Once | CUTANEOUS | Status: DC

## 2021-02-24 MED ORDER — CEFAZOLIN SODIUM-DEXTROSE 2-4 GM/100ML-% IV SOLN
2.0000 g | INTRAVENOUS | Status: AC
  Administered 2021-02-24: 2 g via INTRAVENOUS
  Filled 2021-02-24: qty 100

## 2021-02-24 MED ORDER — FENTANYL CITRATE (PF) 100 MCG/2ML IJ SOLN
25.0000 ug | INTRAMUSCULAR | Status: DC | PRN
  Administered 2021-02-24: 25 ug via INTRAVENOUS

## 2021-02-24 MED ORDER — AMISULPRIDE (ANTIEMETIC) 5 MG/2ML IV SOLN: 10.0000 mg | Freq: Once | INTRAVENOUS | Status: DC | PRN

## 2021-02-24 MED ORDER — FENTANYL CITRATE (PF) 100 MCG/2ML IJ SOLN
INTRAMUSCULAR | Status: AC
  Filled 2021-02-24: qty 2

## 2021-02-24 MED ORDER — DEXAMETHASONE SODIUM PHOSPHATE 10 MG/ML IJ SOLN
INTRAMUSCULAR | Status: AC
  Filled 2021-02-24: qty 1

## 2021-02-24 MED ORDER — PROMETHAZINE HCL 25 MG/ML IJ SOLN: 6.2500 mg | INTRAMUSCULAR | Status: DC | PRN

## 2021-02-24 MED ORDER — PROPOFOL 10 MG/ML IV BOLUS
INTRAVENOUS | Status: DC | PRN
  Administered 2021-02-24: 200 mg via INTRAVENOUS

## 2021-02-24 MED ORDER — LIDOCAINE HCL 2 % IJ SOLN
INTRAMUSCULAR | Status: AC
  Filled 2021-02-24: qty 40

## 2021-02-24 MED ORDER — ONDANSETRON HCL 4 MG/2ML IJ SOLN
INTRAMUSCULAR | Status: AC
  Filled 2021-02-24: qty 2

## 2021-02-24 MED ORDER — ACETAMINOPHEN 500 MG PO TABS
1000.0000 mg | ORAL_TABLET | ORAL | Status: AC
  Administered 2021-02-24: 1000 mg via ORAL
  Filled 2021-02-24: qty 2

## 2021-02-24 MED ORDER — DEXAMETHASONE SODIUM PHOSPHATE 10 MG/ML IJ SOLN
INTRAMUSCULAR | Status: DC | PRN
  Administered 2021-02-24: 8 mg via INTRAVENOUS

## 2021-02-24 MED ORDER — BUPIVACAINE-EPINEPHRINE 0.25% -1:200000 IJ SOLN
INTRAMUSCULAR | Status: DC | PRN
  Administered 2021-02-24: 30 mL

## 2021-02-24 MED ORDER — ROCURONIUM BROMIDE 10 MG/ML (PF) SYRINGE
PREFILLED_SYRINGE | INTRAVENOUS | Status: DC | PRN
  Administered 2021-02-24: 40 mg via INTRAVENOUS

## 2021-02-24 MED ORDER — ONDANSETRON HCL 4 MG/2ML IJ SOLN
INTRAMUSCULAR | Status: DC | PRN
  Administered 2021-02-24: 4 mg via INTRAVENOUS

## 2021-02-24 MED ORDER — FENTANYL CITRATE (PF) 100 MCG/2ML IJ SOLN
INTRAMUSCULAR | Status: AC
  Administered 2021-02-24: 25 ug via INTRAVENOUS
  Filled 2021-02-24: qty 2

## 2021-02-24 MED ORDER — LACTATED RINGERS IV SOLN: INTRAVENOUS | Status: DC

## 2021-02-24 MED ORDER — KETOROLAC TROMETHAMINE 30 MG/ML IJ SOLN
INTRAMUSCULAR | Status: AC
  Filled 2021-02-24: qty 1

## 2021-02-24 MED ORDER — OXYCODONE HCL 5 MG PO TABS: 5.0000 mg | ORAL_TABLET | Freq: Four times a day (QID) | ORAL | 0 refills | Status: AC | PRN

## 2021-02-24 MED ORDER — SUGAMMADEX SODIUM 200 MG/2ML IV SOLN
INTRAVENOUS | Status: DC | PRN
  Administered 2021-02-24: 100 mg via INTRAVENOUS

## 2021-02-24 MED ORDER — LIDOCAINE 2% (20 MG/ML) 5 ML SYRINGE
INTRAMUSCULAR | Status: DC | PRN
  Administered 2021-02-24: 60 mg via INTRAVENOUS

## 2021-02-24 MED ORDER — BUPIVACAINE-EPINEPHRINE (PF) 0.25% -1:200000 IJ SOLN
INTRAMUSCULAR | Status: AC
  Filled 2021-02-24: qty 30

## 2021-02-24 MED ORDER — LIDOCAINE 2% (20 MG/ML) 5 ML SYRINGE
INTRAMUSCULAR | Status: AC
  Filled 2021-02-24: qty 5

## 2021-02-24 MED ORDER — MIDAZOLAM HCL 5 MG/5ML IJ SOLN
INTRAMUSCULAR | Status: DC | PRN
  Administered 2021-02-24: 2 mg via INTRAVENOUS

## 2021-02-24 MED ORDER — CELECOXIB 200 MG PO CAPS
400.0000 mg | ORAL_CAPSULE | ORAL | Status: AC
  Administered 2021-02-24: 400 mg via ORAL
  Filled 2021-02-24: qty 2

## 2021-02-24 MED ORDER — ORAL CARE MOUTH RINSE: 15.0000 mL | Freq: Once | OROMUCOSAL | Status: AC

## 2021-02-24 MED ORDER — FENTANYL CITRATE (PF) 100 MCG/2ML IJ SOLN
INTRAMUSCULAR | Status: DC | PRN
  Administered 2021-02-24: 100 ug via INTRAVENOUS

## 2021-02-24 MED ORDER — MIDAZOLAM HCL 2 MG/2ML IJ SOLN
INTRAMUSCULAR | Status: AC
  Filled 2021-02-24: qty 2

## 2021-02-24 MED ORDER — OXYCODONE HCL 5 MG PO TABS
ORAL_TABLET | ORAL | Status: AC
  Administered 2021-02-24: 5 mg via ORAL
  Filled 2021-02-24: qty 1

## 2021-02-24 MED ORDER — KETOROLAC TROMETHAMINE 30 MG/ML IJ SOLN
30.0000 mg | Freq: Once | INTRAMUSCULAR | Status: AC | PRN
  Administered 2021-02-24: 30 mg via INTRAVENOUS

## 2021-02-24 MED ORDER — 0.9 % SODIUM CHLORIDE (POUR BTL) OPTIME
TOPICAL | Status: DC | PRN
  Administered 2021-02-24: 1000 mL

## 2021-02-24 MED ORDER — OXYCODONE HCL 5 MG/5ML PO SOLN: 5.0000 mg | Freq: Once | ORAL | Status: AC | PRN

## 2021-02-24 MED ORDER — LIDOCAINE 20MG/ML (2%) 15 ML SYRINGE OPTIME
INTRAMUSCULAR | Status: DC | PRN
  Administered 2021-02-24: 1 mg/kg/h via INTRAVENOUS

## 2021-02-24 MED ORDER — CHLORHEXIDINE GLUCONATE 0.12 % MT SOLN
15.0000 mL | Freq: Once | OROMUCOSAL | Status: AC
  Administered 2021-02-24: 15 mL via OROMUCOSAL

## 2021-02-24 MED ORDER — PROPOFOL 10 MG/ML IV BOLUS
INTRAVENOUS | Status: AC
  Filled 2021-02-24: qty 20

## 2021-02-24 MED ORDER — IBUPROFEN 800 MG PO TABS: 800.0000 mg | ORAL_TABLET | Freq: Three times a day (TID) | ORAL | 0 refills | Status: AC | PRN

## 2021-02-24 SURGICAL SUPPLY — 28 items
CHLORAPREP W/TINT 26 (MISCELLANEOUS) ×2 IMPLANT
COVER SURGICAL LIGHT HANDLE (MISCELLANEOUS) ×2 IMPLANT
COVER WAND RF STERILE (DRAPES) IMPLANT
DECANTER SPIKE VIAL GLASS SM (MISCELLANEOUS) ×2 IMPLANT
DERMABOND ADVANCED (GAUZE/BANDAGES/DRESSINGS) ×1
DERMABOND ADVANCED .7 DNX12 (GAUZE/BANDAGES/DRESSINGS) ×1 IMPLANT
DRAPE LAPAROSCOPIC ABDOMINAL (DRAPES) ×2 IMPLANT
ELECT REM PT RETURN 15FT ADLT (MISCELLANEOUS) ×2 IMPLANT
GLOVE SURG POLYISO LF SZ7 (GLOVE) ×2 IMPLANT
GLOVE SURG UNDER POLY LF SZ7 (GLOVE) ×2 IMPLANT
GOWN STRL REUS W/TWL LRG LVL3 (GOWN DISPOSABLE) ×2 IMPLANT
GOWN STRL REUS W/TWL XL LVL3 (GOWN DISPOSABLE) ×2 IMPLANT
KIT BASIN OR (CUSTOM PROCEDURE TRAY) ×2 IMPLANT
KIT TURNOVER KIT A (KITS) ×2 IMPLANT
MESH VENTRALEX ST 8CM LRG (Mesh General) ×2 IMPLANT
NEEDLE HYPO 22GX1.5 SAFETY (NEEDLE) ×2 IMPLANT
NEEDLE MAYO 6 CRC TAPER PT (NEEDLE) ×2 IMPLANT
PACK BASIC VI WITH GOWN DISP (CUSTOM PROCEDURE TRAY) ×2 IMPLANT
PENCIL SMOKE EVACUATOR (MISCELLANEOUS) IMPLANT
SPONGE LAP 4X18 RFD (DISPOSABLE) ×2 IMPLANT
SUT MNCRL AB 4-0 PS2 18 (SUTURE) ×2 IMPLANT
SUT NOVA NAB GS-21 0 18 T12 DT (SUTURE) IMPLANT
SUT PDS AB 0 CT1 36 (SUTURE) ×4 IMPLANT
SUT VIC AB 3-0 SH 27 (SUTURE) ×2
SUT VIC AB 3-0 SH 27X BRD (SUTURE) ×1 IMPLANT
SYR CONTROL 10ML LL (SYRINGE) ×2 IMPLANT
TOWEL OR 17X26 10 PK STRL BLUE (TOWEL DISPOSABLE) ×2 IMPLANT
TOWEL OR NON WOVEN STRL DISP B (DISPOSABLE) ×2 IMPLANT

## 2021-02-24 NOTE — Discharge Instructions (Signed)
[facility] _______[facility]  UMBILICAL OR INGUINAL HERNIA REPAIR: POST OP INSTRUCTIONS  Always review your discharge instruction sheet given to you by the facility where your surgery was performed. IF YOU HAVE DISABILITY OR FAMILY LEAVE FORMS, YOU MUST BRING THEM TO THE OFFICE FOR PROCESSING.   DO NOT GIVE THEM TO YOUR DOCTOR.  1. A  prescription for pain medication may be given to you upon discharge.  Take your pain medication as prescribed, if needed.  If narcotic pain medicine is not needed, then you may take acetaminophen (Tylenol) or ibuprofen (Advil) as needed. 2. Take your usually prescribed medications unless otherwise directed. If you need a refill on your pain medication, please contact your pharmacy.  They will contact our office to request authorization. Prescriptions will not be filled after 5 pm or on week-ends. 3. You should follow a light diet the first 24 hours after arrival home, such as soup and crackers, etc.  Be sure to include lots of fluids daily.  Resume your normal diet the day after surgery. 4.Most patients will experience some swelling and bruising around the umbilicus or in the groin and scrotum.  Ice packs and reclining will help.  Swelling and bruising can take several days to resolve.  6. It is common to experience some constipation if taking pain medication after surgery.  Increasing fluid intake and taking a stool softener (such as Colace) will usually help or prevent this problem from occurring.  A mild laxative (Milk of Magnesia or Miralax) should be taken according to package directions if there are no bowel movements after 48 hours. 7. Unless discharge instructions indicate otherwise, you may remove your bandages 24-48 hours after surgery, and you may shower at that time.  You may have steri-strips (small skin tapes) in place directly over the incision.  These strips should be left on the skin for 7-10 days.  If your surgeon used skin glue on the  incision, you may shower in 24 hours.  The glue will flake off over the next 2-3 weeks.  Any sutures or staples will be removed at the office during your follow-up visit. 8. ACTIVITIES:  You may resume regular (light) daily activities beginning the next day--such as daily self-care, walking, climbing stairs--gradually increasing activities as tolerated.  You may have sexual intercourse when it is comfortable.  Refrain from any heavy lifting or straining until approved by your doctor.  a.You may drive when you are no longer taking prescription pain medication, you can comfortably wear a seatbelt, and you can safely maneuver your car and apply brakes. b.RETURN TO WORK:   _____________________________________________  9.You should see your doctor in the office for a follow-up appointment approximately 2-3 weeks after your surgery.  Make sure that you call for this appointment within a day or two after you arrive home to insure a convenient appointment time. 10.OTHER INSTRUCTIONS: _________________________    _____________________________________  WHEN TO CALL YOUR DOCTOR: Fever over 101.0 Inability to urinate Nausea and/or vomiting Extreme swelling or bruising Continued bleeding from incision. Increased pain, redness, or drainage from the incision  The clinic staff is available to answer your questions during regular business hours.  Please don't hesitate to call and ask to speak to one of the nurses for clinical concerns.  If you have a medical emergency, go to the nearest emergency room or call 911.  A surgeon from [facility] is always on call at the hospital   [address], [address],   [place], [place]  [address] ?  [address] [address], [place],    [address] [phone] ? [phone] ? FAX [phone] Web site: [email or web address]  

## 2021-02-24 NOTE — Anesthesia Postprocedure Evaluation (Signed)
Anesthesia Post Note  Patient: Tina Pruitt  Procedure(s) Performed: OPEN HERNIA REPAIR UMBILICAL ADULT WITH MESH (Abdomen)     Patient location during evaluation: PACU Anesthesia Type: General Level of consciousness: awake Pain management: pain level controlled Vital Signs Assessment: post-procedure vital signs reviewed and stable Respiratory status: spontaneous breathing, nonlabored ventilation, respiratory function stable and patient connected to nasal cannula oxygen Cardiovascular status: blood pressure returned to baseline and stable Postop Assessment: no apparent nausea or vomiting Anesthetic complications: no   No notable events documented.  Last Vitals:  Vitals:   02/24/21 1000 02/24/21 1015  BP: 120/85 113/81  Pulse: 87 88  Resp: 18 18  Temp: 36.9 C   SpO2: 98% 98%    Last Pain:  Vitals:   02/24/21 1015  TempSrc:   PainSc: 1                  Ryla Cauthon P Alisan Dokes

## 2021-02-24 NOTE — Anesthesia Procedure Notes (Signed)
Procedure Name: Intubation Date/Time: 02/24/2021 7:42 AM Performed by: Mitzie Na, CRNA Pre-anesthesia Checklist: Patient identified, Emergency Drugs available, Suction available and Patient being monitored Patient Re-evaluated:Patient Re-evaluated prior to induction Oxygen Delivery Method: Circle system utilized Preoxygenation: Pre-oxygenation with 100% oxygen Induction Type: IV induction Ventilation: Mask ventilation without difficulty Laryngoscope Size: Mac and 3 Grade View: Grade I Tube type: Oral Tube size: 7.0 mm Number of attempts: 2 (EMT student Jarrett Soho attempted X1. ETT verified to be in stomach. no ETCO2 or chest rise. ETT removed and sucessful intubation by [facility] CRNA. OG tube inserted to decompress stomach.) Airway Equipment and Method: Stylet and Oral airway Placement Confirmation: ETT inserted through vocal cords under direct vision, positive ETCO2 and breath sounds checked- equal and bilateral Secured at: 22 cm Tube secured with: Tape Dental Injury: Injury to lip  Comments: Small Lip laceration by EMT student Jarrett Soho during intubation attempt. Vaseline applied to lip laceration.

## 2021-02-24 NOTE — Op Note (Signed)
PATIENT:  Tina Pruitt  35 y.o. female  PRE-OPERATIVE DIAGNOSIS:  UMBILICAL HERNIA  POST-OPERATIVE DIAGNOSIS:  UMBILICAL HERNIA  PROCEDURE:  Procedure(s): OPEN HERNIA REPAIR UMBILICAL ADULT WITH MESH  SURGEON:  Surgeon(s): Alric Geise, De Blanch, MD  ASSISTANT: none   ANESTHESIA:   local and general  Indications for procedure: Tina Pruitt is a 35 y.o. year old female with symptoms of abdominal pain and bulge.  Description of procedure: The patient was brought into the operative suite. Anesthesia was administered with General endotracheal anesthesia. WHO checklist was applied. The patient was then placed in supine. The area was prepped and draped in the usual sterile fashion.  Next the infraumbilical skin was anesthetized with Marcaine with Epinephrine. A semilunar infraumbilical incision was made. Cautery and blunt dissection was used to dissect down to the fascia. The hernia sac was dissected free from surrounding tissues in 360 degrees. The umbilical skin was dissected free of the hernia sac with cautery. She also had a second hernia 3-4 cm superior the umbilical hernia that was larger. The umbilical hernia was 5 mm and reduced. The larger supraumbilical hernia was 2 cm in diameter and contained omental fat. The hernia sac was removed. Due to the size of the hernia, a 8 cm ventralex mesh was placed as an underlay using 5 0 novafils to anchor the mesh. The fascial defect was then primarily closed with interrupted 0 PDS sutures. The umbilical skin was sutured to the fascia with a 3-0 vicryl. The deep dermal space was closed with a 3-0 vicryl. Marcaine with Epinephrine was injected into the muscle layer and around the fascia. The skin was closed with a 4-0 monocryl subcuticular suture. Dermabond was put in place for dressing. The patient awoke from anesthesia and was brought to pacu in stable condition. All counts were correct.  Findings: 2 cm supraumbilical hernia, 5 mm umbilical  hernia  Specimen: none  Blood loss: 20 ml  Local anesthesia: 30 ml Marcaine with Epinephrine  Complications: none  PLAN OF CARE: Discharge to home after PACU  PATIENT DISPOSITION:  PACU - hemodynamically stable.  Feliciana Rossetti, M.D. General, Bariatric, & Minimally Invasive Surgery [facility], [place]  02/24/2021 8:58 AM

## 2021-02-24 NOTE — Transfer of Care (Signed)
Immediate Anesthesia Transfer of Care Note  Patient: Tina Pruitt  Procedure(s) Performed: OPEN HERNIA REPAIR UMBILICAL ADULT WITH MESH (Abdomen)  Patient Location: PACU  Anesthesia Type:General  Level of Consciousness: awake, alert , oriented and patient cooperative  Airway & Oxygen Therapy: Patient Spontanous Breathing and Patient connected to face mask oxygen  Post-op Assessment: Report given to RN, Post -op Vital signs reviewed and stable and Patient moving all extremities  Post vital signs: Reviewed and stable  Last Vitals:  Vitals Value Taken Time  BP 125/79 02/24/21 0905  Temp    Pulse 109 02/24/21 0907  Resp 22 02/24/21 0907  SpO2 99 % 02/24/21 0907  Vitals shown include unvalidated device data.  Last Pain:  Vitals:   02/24/21 0554  TempSrc:   PainSc: 0-No pain         Complications: No notable events documented.

## 2021-02-24 NOTE — H&P (Signed)
Tina Pruitt is an 35 y.o. female.   Chief Complaint: hernia HPI: 35 yo female with symptomatic umbilical hernia. She presents for repair.  Past Medical History:  Diagnosis Date   Medical history non-contributory    No pertinent past medical history     Past Surgical History:  Procedure Laterality Date   CESAREAN SECTION  06/21/2012   Procedure: CESAREAN SECTION;  Surgeon: Tereso Newcomer, MD;  Location: [facility];  Service: Obstetrics;  Laterality: N/A;    History reviewed. No pertinent family history. Social History:  reports that she has never smoked. She has never used smokeless tobacco. She reports that she does not drink alcohol and does not use drugs.  Allergies: No Known Allergies  Medications Prior to Admission  Medication Sig Dispense Refill   Multiple Vitamins-Minerals (MULTIVITAMIN WITH MINERALS) tablet Take 1 tablet by mouth daily.      Results for orders placed or performed during the hospital encounter of 02/24/21 (from the past 48 hour(s))  Pregnancy, urine     Status: None   Collection Time: 02/24/21  5:18 AM  Result Value Ref Range   Preg Test, Ur NEGATIVE NEGATIVE    Comment:        THE SENSITIVITY OF THIS METHODOLOGY IS >20 mIU/mL. Performed at [facility], [address], [place], [place] [address]   CBC per protocol     Status: None   Collection Time: 02/24/21  5:47 AM  Result Value Ref Range   WBC 6.9 4.0 - 10.5 K/uL   RBC 4.21 3.87 - 5.11 MIL/uL   Hemoglobin 13.1 12.0 - 15.0 g/dL   HCT 48.1 85.6 - 31.4 %   MCV 91.9 80.0 - 100.0 fL   MCH 31.1 26.0 - 34.0 pg   MCHC 33.9 30.0 - 36.0 g/dL   RDW 97.0 26.3 - 78.5 %   Platelets 395 150 - 400 K/uL   nRBC 0.0 0.0 - 0.2 %    Comment: Performed at [facility], [address] [place], [place] [address]   No results found.  Review of Systems  Constitutional:  Negative for chills and fever.  HENT:  Negative for hearing loss.   Respiratory:  Negative for cough.    Cardiovascular:  Negative for chest pain and palpitations.  Gastrointestinal:  Negative for abdominal pain, nausea and vomiting.  Genitourinary:  Negative for dysuria and urgency.  Musculoskeletal:  Negative for myalgias and neck pain.  Skin:  Negative for rash.  Neurological:  Negative for dizziness and headaches.  Hematological:  Does not bruise/bleed easily.  Psychiatric/Behavioral:  Negative for suicidal ideas.    Blood pressure 125/87, pulse (!) 102, temperature 98.6 F (37 C), temperature source Oral, resp. rate 14, height 5\' 2"  (1.575 m), weight 69.9 kg, last menstrual period 02/06/2021, SpO2 98 %, unknown if currently breastfeeding. Physical Exam Vitals reviewed.  Constitutional:      Appearance: She is well-developed.  HENT:     Head: Normocephalic and atraumatic.  Eyes:     Conjunctiva/sclera: Conjunctivae normal.     Pupils: Pupils are equal, round, and reactive to light.  Cardiovascular:     Rate and Rhythm: Normal rate and regular rhythm.  Pulmonary:     Effort: Pulmonary effort is normal.     Breath sounds: Normal breath sounds.  Abdominal:     General: Bowel sounds are normal. There is no distension.     Palpations: Abdomen is soft.     Tenderness: There is no  abdominal tenderness.     Comments: Umbilical hernia  Musculoskeletal:        General: Normal range of motion.     Cervical back: Normal range of motion and neck supple.  Skin:    General: Skin is warm and dry.  Neurological:     Mental Status: She is alert and oriented to person, place, and time.  Psychiatric:        Behavior: Behavior normal.     Assessment/Plan 35 yo female with umbilical hernia -open umbilical hernia repair -ERAs protocols -outpatient procedure  Rodman Pickle, MD 02/24/2021, 7:29 AM

## 2021-02-25 ENCOUNTER — Encounter (HOSPITAL_COMMUNITY): Payer: Self-pay | Admitting: General Surgery
